# Patient Record
Sex: Male | Born: 1970
Health system: Southern US, Community
[De-identification: ages and names within clinical notes are randomized; demographics above are authoritative.]

## PROBLEM LIST (undated history)

## (undated) DIAGNOSIS — I219 Acute myocardial infarction, unspecified: Secondary | ICD-10-CM

## (undated) DIAGNOSIS — E119 Type 2 diabetes mellitus without complications: Secondary | ICD-10-CM

## (undated) DIAGNOSIS — Z8489 Family history of other specified conditions: Secondary | ICD-10-CM

## (undated) DIAGNOSIS — N189 Chronic kidney disease, unspecified: Secondary | ICD-10-CM

## (undated) DIAGNOSIS — I10 Essential (primary) hypertension: Secondary | ICD-10-CM

## (undated) DIAGNOSIS — E785 Hyperlipidemia, unspecified: Secondary | ICD-10-CM

## (undated) DIAGNOSIS — I2510 Atherosclerotic heart disease of native coronary artery without angina pectoris: Secondary | ICD-10-CM

## (undated) HISTORY — PX: ARTHOSCOPIC ROTAOR CUFF REPAIR: SHX5002

## (undated) HISTORY — PX: APPENDECTOMY: SHX54

## (undated) HISTORY — PX: CHOLECYSTECTOMY: SHX55

---

## 2010-06-08 ENCOUNTER — Emergency Department (HOSPITAL_COMMUNITY): Payer: 59

## 2010-06-08 ENCOUNTER — Emergency Department (HOSPITAL_COMMUNITY)
Admission: EM | Admit: 2010-06-08 | Discharge: 2010-06-08 | Disposition: A | Discharge status: Home / Self Care | Payer: 59 | Attending: Emergency Medicine | Admitting: Emergency Medicine

## 2010-06-08 DIAGNOSIS — S61209A Unspecified open wound of unspecified finger without damage to nail, initial encounter: Secondary | ICD-10-CM | POA: Insufficient documentation

## 2010-06-08 DIAGNOSIS — Y998 Other external cause status: Secondary | ICD-10-CM | POA: Insufficient documentation

## 2010-06-08 DIAGNOSIS — W230XXA Caught, crushed, jammed, or pinched between moving objects, initial encounter: Secondary | ICD-10-CM | POA: Insufficient documentation

## 2010-06-08 DIAGNOSIS — E119 Type 2 diabetes mellitus without complications: Secondary | ICD-10-CM | POA: Insufficient documentation

## 2014-04-21 DIAGNOSIS — I10 Essential (primary) hypertension: Secondary | ICD-10-CM | POA: Diagnosis present

## 2014-06-12 DIAGNOSIS — E1169 Type 2 diabetes mellitus with other specified complication: Secondary | ICD-10-CM | POA: Insufficient documentation

## 2015-04-17 DIAGNOSIS — E66811 Obesity, class 1: Secondary | ICD-10-CM | POA: Insufficient documentation

## 2015-04-17 DIAGNOSIS — E669 Obesity, unspecified: Secondary | ICD-10-CM | POA: Insufficient documentation

## 2015-04-17 DIAGNOSIS — F4322 Adjustment disorder with anxiety: Secondary | ICD-10-CM | POA: Insufficient documentation

## 2015-05-11 DIAGNOSIS — I771 Stricture of artery: Secondary | ICD-10-CM | POA: Insufficient documentation

## 2015-05-11 DIAGNOSIS — I774 Celiac artery compression syndrome: Secondary | ICD-10-CM | POA: Insufficient documentation

## 2017-09-14 DIAGNOSIS — K859 Acute pancreatitis without necrosis or infection, unspecified: Secondary | ICD-10-CM | POA: Insufficient documentation

## 2018-05-08 DIAGNOSIS — Z794 Long term (current) use of insulin: Secondary | ICD-10-CM

## 2018-05-08 DIAGNOSIS — E1165 Type 2 diabetes mellitus with hyperglycemia: Secondary | ICD-10-CM | POA: Insufficient documentation

## 2019-03-28 ENCOUNTER — Other Ambulatory Visit: Payer: Self-pay | Admitting: Neurological Surgery

## 2019-03-28 DIAGNOSIS — M545 Low back pain, unspecified: Secondary | ICD-10-CM

## 2019-04-01 ENCOUNTER — Ambulatory Visit
Admission: RE | Admit: 2019-04-01 | Discharge: 2019-04-01 | Disposition: A | Discharge status: Home / Self Care | Payer: BC Managed Care – PPO | Source: Ambulatory Visit | Attending: Neurological Surgery | Admitting: Neurological Surgery

## 2019-04-01 ENCOUNTER — Other Ambulatory Visit: Payer: Self-pay

## 2019-04-01 DIAGNOSIS — M545 Low back pain, unspecified: Secondary | ICD-10-CM

## 2019-04-01 IMAGING — MR MR LUMBAR SPINE W/O CM
4 of 5 series · 14 of 48 positions shown · non-contrast
Comparison: No pertinent prior studies available for comparison.

CLINICAL DATA: Low back pain. Additional history provided by
[HOSPITAL]weeks of bilateral low back pain that runs into
anterior left leg accompanied by numbness and weakness.

EXAM:
MRI LUMBAR SPINE WITHOUT CONTRAST
TECHNIQUE: Multiplanar, multisequence MR imaging of the lumbar spine was
performed. No intravenous contrast was administered.

[Series 5: T2 · sagittal · 4.0mm · 0.44mm/px · 5 of 13 slices shown (1 of 2)]
[im 1/13]
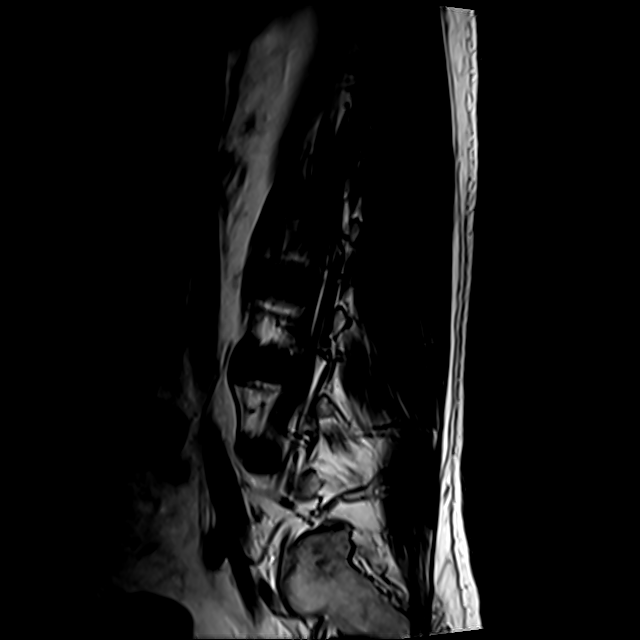
[im 3/13]
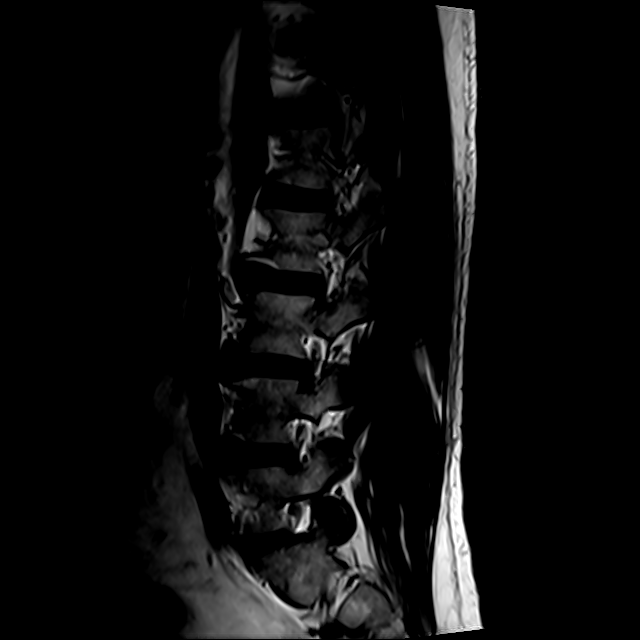
[im 5/13]
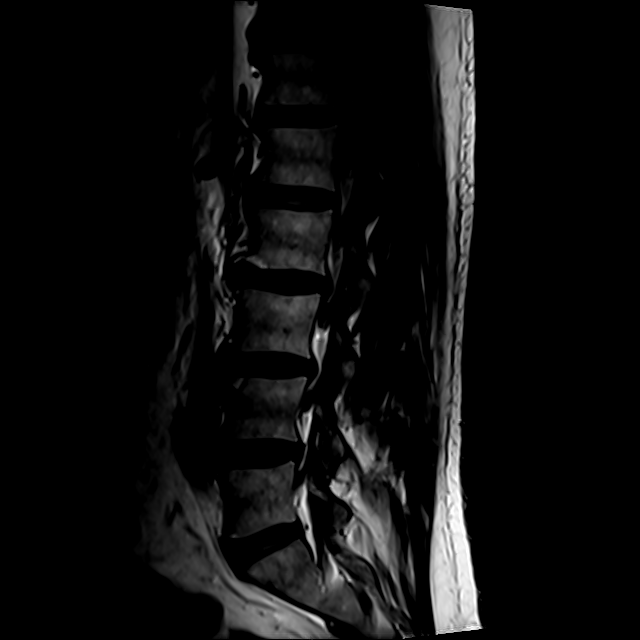
[im 8/13]
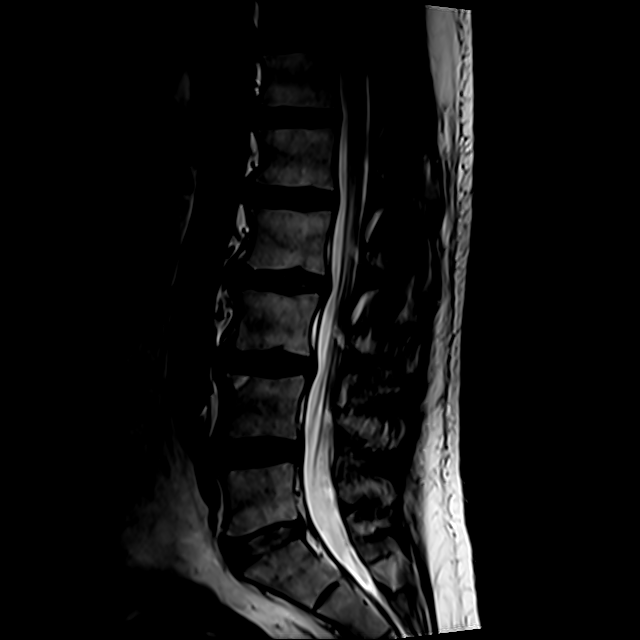
[im 13/13]
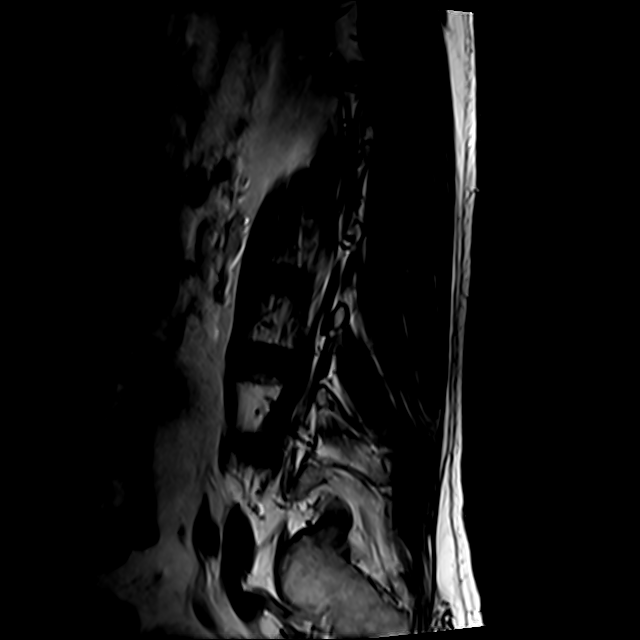

[Series 6: T1 · sagittal · 4.0mm · 0.44mm/px · 3 of 13 slices shown (1 of 2)]
[im 1/13]
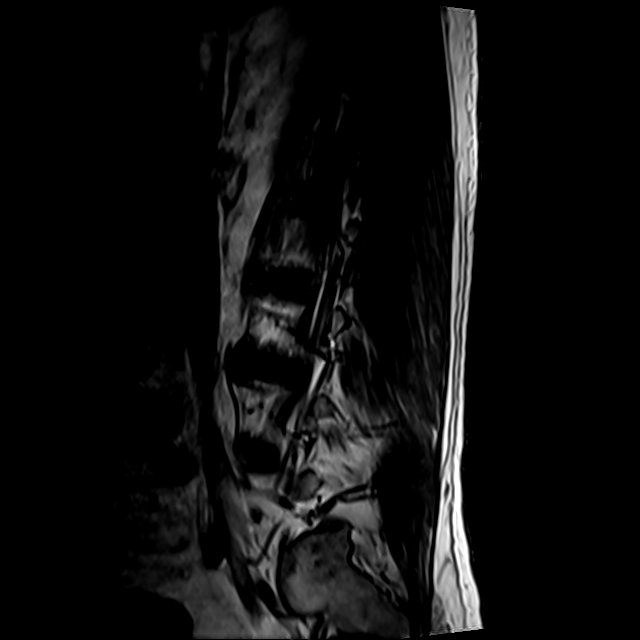
[im 7/13]
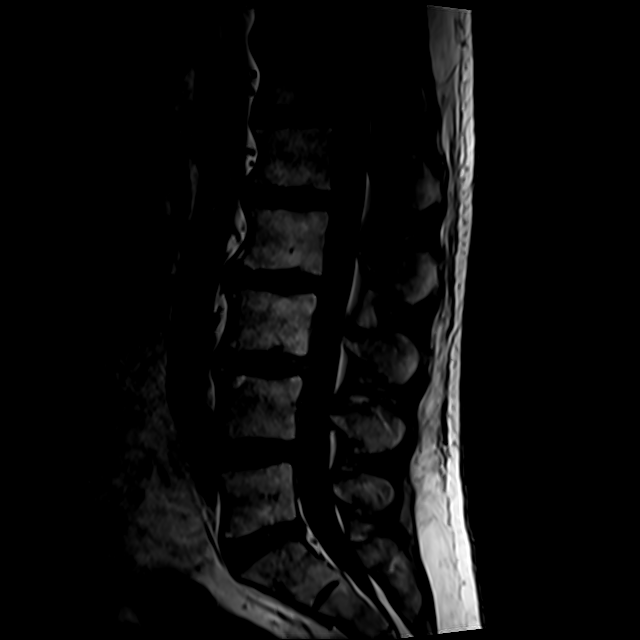
[im 13/13]
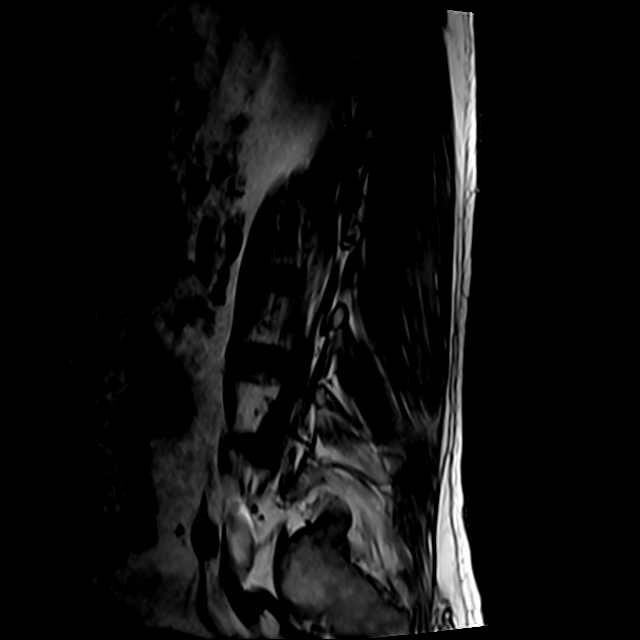

[Series 10: T2 · axial · 4.0mm · 0.35mm/px · z∈[-85,+78]mm · 3 of 39 slices shown (2 of 2)]
[im 6/39]
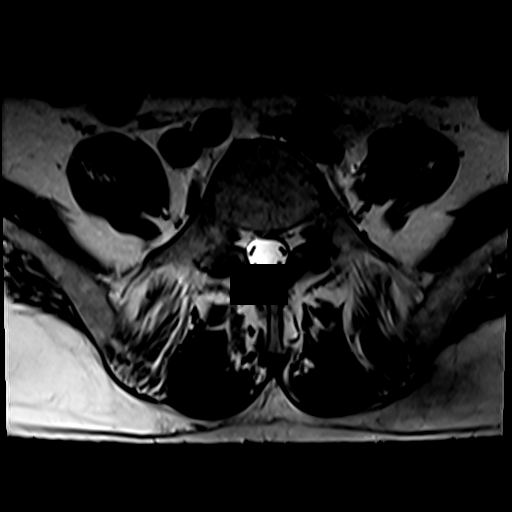
[im 21/39]
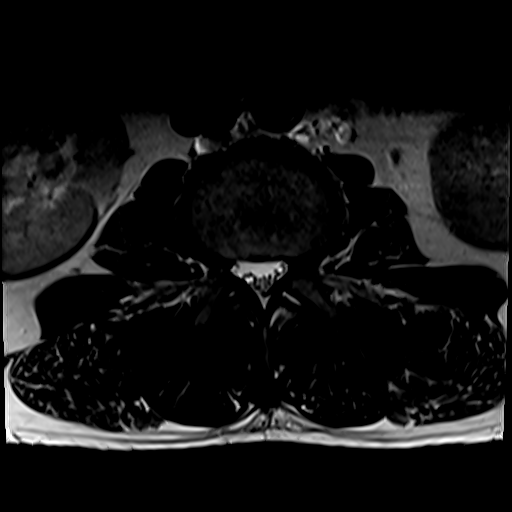
[im 33/39]
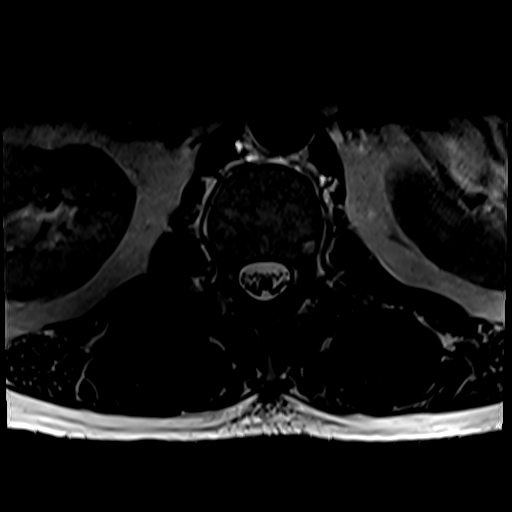

[Series 100: T1 · axial · 4.0mm · 0.35mm/px · z∈[-85,+78]mm · 3 of 39 slices shown (2 of 2)]
[im 6/39]
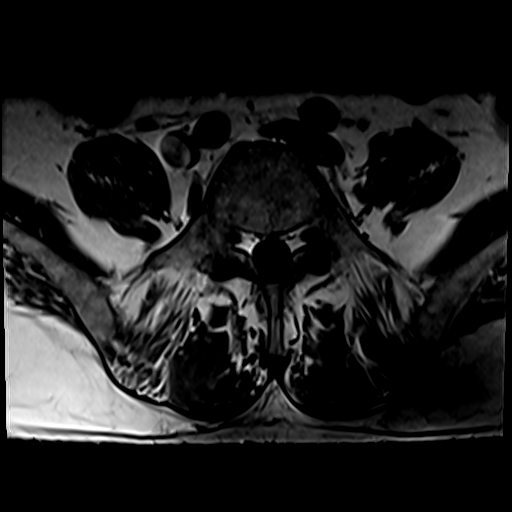
[im 21/39]
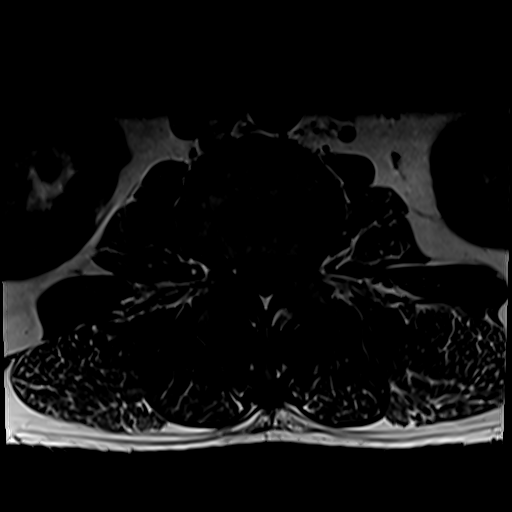
[im 33/39]
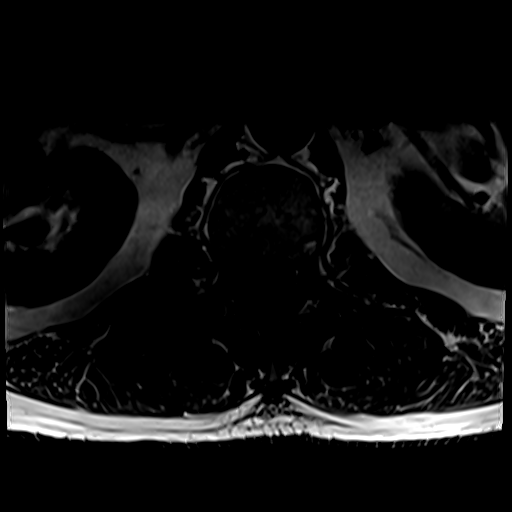

[14 of 48 positions shown; findings below may reference images not displayed]

FINDINGS: Segmentation: For the purposes of this dictation, five lumbar
vertebrae are assumed and the caudal most well-formed intervertebral
disc is designated L5-S1.

Alignment: Reversal of the expected lumbar lordosis. Trace
retrolisthesis at the L2-L3 through L4-L5 levels.

Vertebrae: Vertebral body height is maintained. Trace fatty
degenerative endplate marrow signal anteriorly at L2-L3 and L3-L4.
No marrow edema or suspicious osseous lesion.

Conus medullaris and cauda equina: Conus extends to the L1-L2 level.
No signal abnormality within the visualized distal spinal cord.

Paraspinal and other soft tissues: Mild asymmetric prominence of the
left renal pelvis without overt hydronephrosis. Paraspinal soft
tissues within normal limits.

Disc levels:

Mild multilevel disc degeneration.

Congenitally narrow lumbar spinal canal.

T12-L1: No disc herniation. No significant degenerative canal or
foraminal stenosis.

L1-L2: Mild facet arthrosis. No disc herniation. No significant
degenerative canal or foraminal stenosis.

L2-L3: Disc bulge. Mild facet arthrosis/ligamentum flavum
hypertrophy. Mild bilateral subarticular narrowing (greater on the
left) with crowding of the descending left L3 nerve root (series 10,
image 18). Mild relative narrowing of the central canal. No
significant neural foraminal narrowing.

L3-L4: Disc bulge. Mild facet arthrosis/ligamentum flavum
hypertrophy. Bilateral subarticular narrowing (greater on the left)
with crowding of the descending left L4 nerve root (series 10, image
23). There is also mild relative narrowing of the central canal and
left neural foramen.

L4-L5: Minimal disc bulge. Mild facet arthrosis/ligamentum flavum
hypertrophy. Mild left subarticular/lateral recess narrowing with
slight crowding of the descending left L5 nerve root. Central canal
patent. No significant neural foraminal narrowing.

L5-S1: Minimal disc bulge. Mild facet arthrosis. No significant
spinal canal or neural foraminal narrowing.
IMPRESSION: Lumbar spondylosis superimposed upon a congenitally narrow lumbar
spinal canal as outlined and most notably as follows

At L2-L3, multifactorial mild bilateral subarticular narrowing
(greater on the left) with crowding of the descending left L3 nerve
root. Mild relative narrowing of the central canal.

At L3-L4, multifactorial bilateral subarticular narrowing (greater
on the left) with crowding of the descending left L4 nerve root.
Mild relative narrowing of the central canal and left neural
foramen.

At L4-L5, multifactorial left subarticular/lateral recess narrowing
with slight crowding of the descending left L5 nerve root.

## 2020-05-01 ENCOUNTER — Encounter (HOSPITAL_COMMUNITY)
Admission: EM | Disposition: A | Discharge status: Home / Self Care | Payer: Self-pay | Source: Home / Self Care | Attending: Interventional Cardiology

## 2020-05-01 ENCOUNTER — Inpatient Hospital Stay (HOSPITAL_COMMUNITY)
Admission: EM | Admit: 2020-05-01 | Discharge: 2020-05-04 | DRG: 246 | Disposition: A | Discharge status: Home / Self Care | Payer: BC Managed Care – PPO | Attending: Interventional Cardiology | Admitting: Interventional Cardiology

## 2020-05-01 DIAGNOSIS — Z8249 Family history of ischemic heart disease and other diseases of the circulatory system: Secondary | ICD-10-CM

## 2020-05-01 DIAGNOSIS — Z794 Long term (current) use of insulin: Secondary | ICD-10-CM | POA: Diagnosis not present

## 2020-05-01 DIAGNOSIS — I110 Hypertensive heart disease with heart failure: Secondary | ICD-10-CM | POA: Diagnosis present

## 2020-05-01 DIAGNOSIS — I10 Essential (primary) hypertension: Secondary | ICD-10-CM | POA: Diagnosis not present

## 2020-05-01 DIAGNOSIS — Z955 Presence of coronary angioplasty implant and graft: Secondary | ICD-10-CM

## 2020-05-01 DIAGNOSIS — E781 Pure hyperglyceridemia: Secondary | ICD-10-CM | POA: Diagnosis not present

## 2020-05-01 DIAGNOSIS — I2510 Atherosclerotic heart disease of native coronary artery without angina pectoris: Secondary | ICD-10-CM

## 2020-05-01 DIAGNOSIS — E785 Hyperlipidemia, unspecified: Secondary | ICD-10-CM

## 2020-05-01 DIAGNOSIS — F419 Anxiety disorder, unspecified: Secondary | ICD-10-CM | POA: Diagnosis present

## 2020-05-01 DIAGNOSIS — I2111 ST elevation (STEMI) myocardial infarction involving right coronary artery: Secondary | ICD-10-CM | POA: Diagnosis not present

## 2020-05-01 DIAGNOSIS — E1169 Type 2 diabetes mellitus with other specified complication: Secondary | ICD-10-CM

## 2020-05-01 DIAGNOSIS — I5033 Acute on chronic diastolic (congestive) heart failure: Secondary | ICD-10-CM | POA: Diagnosis present

## 2020-05-01 DIAGNOSIS — Z880 Allergy status to penicillin: Secondary | ICD-10-CM

## 2020-05-01 DIAGNOSIS — G4733 Obstructive sleep apnea (adult) (pediatric): Secondary | ICD-10-CM | POA: Diagnosis present

## 2020-05-01 DIAGNOSIS — I2119 ST elevation (STEMI) myocardial infarction involving other coronary artery of inferior wall: Principal | ICD-10-CM | POA: Diagnosis present

## 2020-05-01 DIAGNOSIS — Z79899 Other long term (current) drug therapy: Secondary | ICD-10-CM

## 2020-05-01 DIAGNOSIS — E1165 Type 2 diabetes mellitus with hyperglycemia: Secondary | ICD-10-CM | POA: Diagnosis present

## 2020-05-01 DIAGNOSIS — I213 ST elevation (STEMI) myocardial infarction of unspecified site: Principal | ICD-10-CM

## 2020-05-01 DIAGNOSIS — E782 Mixed hyperlipidemia: Secondary | ICD-10-CM | POA: Diagnosis present

## 2020-05-01 DIAGNOSIS — E118 Type 2 diabetes mellitus with unspecified complications: Secondary | ICD-10-CM | POA: Diagnosis not present

## 2020-05-01 DIAGNOSIS — Z20822 Contact with and (suspected) exposure to covid-19: Secondary | ICD-10-CM | POA: Diagnosis present

## 2020-05-01 DIAGNOSIS — E1159 Type 2 diabetes mellitus with other circulatory complications: Secondary | ICD-10-CM | POA: Diagnosis not present

## 2020-05-01 HISTORY — DX: Atherosclerotic heart disease of native coronary artery without angina pectoris: I25.10

## 2020-05-01 HISTORY — DX: Hyperlipidemia, unspecified: E78.5

## 2020-05-01 HISTORY — DX: Type 2 diabetes mellitus without complications: E11.9

## 2020-05-01 HISTORY — DX: Family history of other specified conditions: Z84.89

## 2020-05-01 HISTORY — DX: Essential (primary) hypertension: I10

## 2020-05-01 HISTORY — PX: LEFT HEART CATH AND CORONARY ANGIOGRAPHY: CATH118249

## 2020-05-01 HISTORY — PX: CORONARY/GRAFT ACUTE MI REVASCULARIZATION: CATH118305

## 2020-05-01 SURGERY — CORONARY/GRAFT ACUTE MI REVASCULARIZATION
Anesthesia: LOCAL

## 2020-05-01 MED ORDER — LIDOCAINE HCL (PF) 1 % IJ SOLN
INTRAMUSCULAR | Status: DC | PRN
  Administered 2020-05-01: 5 mL via SUBCUTANEOUS

## 2020-05-01 MED ORDER — SODIUM CHLORIDE 0.9 % IV SOLN: INTRAVENOUS | Status: DC

## 2020-05-01 MED ORDER — HEPARIN SODIUM (PORCINE) 1000 UNIT/ML IJ SOLN
INTRAMUSCULAR | Status: AC
  Filled 2020-05-01: qty 1

## 2020-05-01 MED ORDER — HEPARIN SODIUM (PORCINE) 1000 UNIT/ML IJ SOLN
INTRAMUSCULAR | Status: DC | PRN
  Administered 2020-05-01: 9000 [IU] via INTRAVENOUS

## 2020-05-01 MED ORDER — VERAPAMIL HCL 2.5 MG/ML IV SOLN
INTRAVENOUS | Status: AC
  Filled 2020-05-01: qty 2

## 2020-05-01 MED ORDER — LIDOCAINE HCL (PF) 1 % IJ SOLN
INTRAMUSCULAR | Status: AC
  Filled 2020-05-01: qty 30

## 2020-05-01 MED ORDER — SODIUM CHLORIDE 0.9 % IV SOLN
INTRAVENOUS | Status: AC | PRN
  Administered 2020-05-01: 100 mL/h via INTRAVENOUS

## 2020-05-01 MED ORDER — HEPARIN SODIUM (PORCINE) 5000 UNIT/ML IJ SOLN: 4000.0000 [IU] | Freq: Once | INTRAMUSCULAR | Status: DC

## 2020-05-01 MED ORDER — TIROFIBAN (AGGRASTAT) BOLUS VIA INFUSION
INTRAVENOUS | Status: DC | PRN
  Administered 2020-05-01: 2500 ug via INTRAVENOUS

## 2020-05-01 MED ORDER — TIROFIBAN HCL IN NACL 5-0.9 MG/100ML-% IV SOLN
INTRAVENOUS | Status: AC
  Filled 2020-05-01: qty 100

## 2020-05-01 MED ORDER — TIROFIBAN HCL IN NACL 5-0.9 MG/100ML-% IV SOLN
INTRAVENOUS | Status: AC | PRN
  Administered 2020-05-01: 0.15 ug/kg/min via INTRAVENOUS

## 2020-05-01 MED ORDER — VERAPAMIL HCL 2.5 MG/ML IV SOLN
INTRAVENOUS | Status: DC | PRN
  Administered 2020-05-01: 10 mL via INTRA_ARTERIAL

## 2020-05-01 MED ORDER — IOHEXOL 350 MG/ML SOLN
INTRAVENOUS | Status: AC
  Filled 2020-05-01: qty 1

## 2020-05-01 MED ORDER — HEPARIN (PORCINE) IN NACL 1000-0.9 UT/500ML-% IV SOLN
INTRAVENOUS | Status: AC
  Filled 2020-05-01: qty 1000

## 2020-05-01 SURGICAL SUPPLY — 18 items
BALLN SAPPHIRE 2.0X15 (BALLOONS) ×2
BALLN SAPPHIRE ~~LOC~~ 3.0X12 (BALLOONS) ×2 IMPLANT
BALLOON SAPPHIRE 2.0X15 (BALLOONS) ×1 IMPLANT
CATH INFINITI 5 FR JL3.5 (CATHETERS) ×2 IMPLANT
CATH LAUNCHER 6FR JR4 (CATHETERS) ×2 IMPLANT
DEVICE RAD COMP TR BAND LRG (VASCULAR PRODUCTS) ×2 IMPLANT
GLIDESHEATH SLEND SS 6F .021 (SHEATH) ×2 IMPLANT
GUIDEWIRE INQWIRE 1.5J.035X260 (WIRE) ×1 IMPLANT
INQWIRE 1.5J .035X260CM (WIRE) ×2
KIT ENCORE 26 ADVANTAGE (KITS) ×2 IMPLANT
KIT HEART LEFT (KITS) ×2 IMPLANT
KIT HEMO VALVE WATCHDOG (MISCELLANEOUS) ×2 IMPLANT
PACK CARDIAC CATHETERIZATION (CUSTOM PROCEDURE TRAY) ×2 IMPLANT
STENT RESOLUTE ONYX 2.5X18 (Permanent Stent) ×2 IMPLANT
SYR MEDRAD MARK 7 150ML (SYRINGE) ×2 IMPLANT
TRANSDUCER W/STOPCOCK (MISCELLANEOUS) ×2 IMPLANT
TUBING CIL FLEX 10 FLL-RA (TUBING) ×2 IMPLANT
WIRE ASAHI PROWATER 180CM (WIRE) ×2 IMPLANT

## 2020-05-01 NOTE — H&P (Addendum)
Cardiology History & Physical    Patient ID: Dan Ford MRN: [id number], DOB/AGE: 03-Jun-1970   Admit date: 05/01/2020  Primary Physician: Shellia Cleverly, PA Primary Cardiologist: No primary care provider on file.  Patient Profile    50 year old male with history of T2DM on insulin, hypertriglceridemia, pancreatitis due to this, anxiety disorder presenting with acute chest pain and inferior STEMI.  History of Present Illness    50 year old male with history as above who developed acute chest pain around 9PM this night.  He reports having had intermittent chest pains for the last three-four days that was stuttering with exertion.  He had some right-sided arm discomfort as well.  This evening he developed acute chest pressure and pain that provoked him to call EMS.  He was found to have inferior STE with lateral depressions and STEMI was activated.  Significant past medical history of pancreatitis induced by hypertriglyceridemia and has been admitted for this as recently as this year.  He has seen an endocrinologist and its suspected this is genetically mediated as diabetes is fairly well controlled.  Past Medical History   T2DM on insulin Hypertriglyceridemia HTN Pancreatitis Anxiety disorder  Allergies Allergies  Allergen Reactions  . Amoxicillin Nausea Only    Home Medications    Prior to Admission medications   Medication Sig Start Date End Date Taking? Authorizing Provider  escitalopram (LEXAPRO) 20 MG tablet 20 mg daily. 05/28/16   [provider]  fenofibrate 160 MG tablet Take 160 mg by mouth daily. 04/04/20   [provider]  lisinopril (ZESTRIL) 10 MG tablet 10 mg daily. 12/05/15   [provider]  Omega-3 1000 MG CAPS Take 1,000 mg by mouth daily.    [provider]  pantoprazole (PROTONIX) 40 MG tablet Take 40 mg by mouth daily. 04/09/20   [provider]  rosuvastatin (CRESTOR) 40 MG tablet Take 40 mg by mouth daily.  04/04/20   [provider]  VASCEPA 1 g capsule Take 2 g by mouth 2 (two) times daily. 04/13/20   [provider]    Family History    Father had CHF Mother with AF  Social History   Non-smoker.  No etOH use.  No drug use.  Accompanied in ED by his attentive wife.  Works as a Production designer, theatre/television/film.  Review of Systems    General:  No chills, fever, night sweats or weight changes.  Cardiovascular:  No chest pain, dyspnea on exertion, edema, orthopnea, palpitations, paroxysmal nocturnal dyspnea. Dermatological: No rash, lesions/masses Respiratory: No cough, dyspnea Urologic: No hematuria, dysuria Abdominal:   No nausea, vomiting, diarrhea, bright red blood per rectum, melena, or hematemesis Neurologic:  No visual changes, wkns, changes in mental status. All other systems reviewed and are otherwise negative except as noted above.  Physical Exam    BP 115/71 (BP Location: Right Arm)   Pulse 94   Temp 97.6 F (36.4 C) (Oral)   Resp 20   SpO2 99%  General: Alert, NAD HEENT: Normal  Neck: No bruits or JVD. Lungs:  Resp regular and unlabored, CTA bilaterally. Heart: Regular rhythm, no s3, s4, or murmurs. Abdomen: Soft, non-tender, non-distended, BS +.  Extremities: Warm. No clubbing, cyanosis or edema. DP/PT/Radials 2+ and equal bilaterally. Psych: Normal affect. Neuro: Alert and oriented. No gross focal deficits. No abnormal movements.  Labs   Labs pending. Historically normal GFR  Radiology Studies    No results found.  ECG & Cardiac Imaging  Normal sinus rhythm, STE III, AVF with slight reciprocal lateral changes. Invasive angigoram, emergent with mild CAD (tapering bifurcating LAD), acute occlusion of distal RCA.  Assessment & Plan    50 year old male with history of HTN, T2DM on insulin, severe hypertriglyceridemia, nonobstructive CAD, obesity, anxiety disorder presenting with acute inferior STEMI now s/p PCI.  Problem list STEMI HTN T2DM on  insulin Hypertriglceridemia with history of recurrent pancreatitis  Plan #STEMI, dRCA s/p PCI - ASA 81 mg indefinitely - Ticagrelor in lab, 90 mg BID at one year - Rosuvastatin 40 mg daily - Start metoprolol tartrate 12.5 mg BID - Admit to CVICU, continuous cardiac monitoring - TTE in morning - Risk stratification labs  #HTN - Continue lisinopril 10 mg daily, titrate as tolerated  #T2DM on insulin therapy - High insulin requirements (50-80 units daily) with recurrent pancreatitis - Insulin infusion in unit, watch closely goal glucose 110-160. - Empagliflozin at discharge - Note GLP1RA contraindicated  Hypertriglyceridemia with history recurrent pancreatitis - Continue rosuvastatin 40 mg daily, Vascepa 2g BID, Fenofibrate 160 mg daily  Malnutrition: None Nutrition: Heart healthy diet, moderate carbohydrate DVT ppx: Lovenox GI ppx: Omeprazole (home med) Advanced Care Planning: Full code   Signed, Regino Schultze, MD 05/01/2020, 11:42 PM  I have examined the patient and reviewed assessment and plan and discussed with patient.  Agree with above as stated.  I personally reviewed the ECG and made the decision for the patient to have emergency cardiac cath.  The patient had been having discomfort over the past 3 days on and off.  It became severe tonight and he called EMS shortly thereafter.  Initial ECG done by EMS showed inferior ST elevations.  Upon arrival to the Cath Lab, his blood pressure was stable but he was still having 7 out of 10 chest pain.   Mildly anxious Regular rate and rhythm 2+ right radial pulse Hospital ECG confirmed inferior ST elevation with high lateral ST depression  Long-term, he will need diabetes management and aggressive risk factor modification including triglyceride lowering therapy.  Further plans based on cardiac catheterization  Lance Muss  Addendum: Cardiac cath revealed occluded distal RCA.  This was successfully stented with a 2.5 x 18  resolute drug-eluting stent, postdilated to 3 mm proximally.  He has diffuse disease in the posterior lateral artery.  Ejection fraction was normal with basal inferior hypokinesis.  LVEDP 11 mmHg. Start low-dose metoprolol, high-dose statin.  Insulin for diabetes management. We will refer to our Pharm.D. lipid clinic to see if there are other therapies or trials that may be of benefit to him.  Corky Crafts, MD

## 2020-05-01 NOTE — ED Provider Notes (Signed)
[facility] EMERGENCY DEPARTMENT Provider Note   CSN: [id number] Arrival date & time: 05/01/20  2309     History Chief Complaint  Patient presents with  . Code STEMI   Level 5 caveat due to acuity of condition Dan Ford is a 50 y.o. male.  The history is provided by the patient and the EMS personnel.  Chest Pain Pain location:  Substernal area Pain quality: pressure   Pain radiates to:  L arm Pain severity:  Severe Onset quality:  Sudden Duration:  1 hour Timing:  Constant Progression:  Worsening Chronicity:  New Relieved by:  Nitroglycerin and aspirin Worsened by:  Nothing Associated symptoms: shortness of breath   Patient presents via EMS for chest pain.  He reports episodes intermittently over the past several days, but this episode began about an hour ago was very severe.  EMS was called who noted the patient was having ST elevation MI Patient given aspirin/nitroglycerin     PMH- diabetes, hyperlipidemia Soc hx - unknown Family history - +CAD Home Medications Prior to Admission medications   Not on File    Allergies    Patient has no allergy information on record.  Review of Systems   Review of Systems  Unable to perform ROS: Acuity of condition  Respiratory: Positive for shortness of breath.   Cardiovascular: Positive for chest pain.    Physical Exam Updated Vital Signs BP 117/71   Pulse 98   Resp (!) 27   SpO2 100%   Physical Exam CONSTITUTIONAL: Ill-appearing HEAD: Normocephalic/atraumatic EYES: EOMI ENMT: Mucous membranes moist NECK: supple no meningeal signs SPINE/BACK:entire spine nontender CV: S1/S2 noted, no murmurs/rubs/gallops noted LUNGS: Lungs are clear to auscultation bilaterally, no apparent distress ABDOMEN: soft, nontender, no rebound or guarding, bowel sounds noted throughout abdomen GU:no cva tenderness NEURO: Pt is awake/alert/appropriate, moves all extremitiesx4.  No facial droop.   EXTREMITIES:  pulses normal/equalx4, full ROM SKIN: Pale PSYCH: Unable to assess  ED Results / Procedures / Treatments   Labs (all labs ordered are listed, but only abnormal results are displayed) Labs Reviewed  RESP PANEL BY RT-PCR (FLU A&B, COVID) ARPGX2  HEMOGLOBIN A1C  CBC WITH DIFFERENTIAL/PLATELET  PROTIME-INR  APTT  COMPREHENSIVE METABOLIC PANEL  LIPID PANEL  TROPONIN I (HIGH SENSITIVITY)    EKG EKG Interpretation  Date/Time:  Tuesday May 01 2020 23:10:09 EST Ventricular Rate:  94 PR Interval:    QRS Duration: 88 QT Interval:  358 QTC Calculation: 448 R Axis:   52 Text Interpretation: Sinus rhythm Inferior infarct, acute (RCA) Lateral leads are also involved Probable RV involvement, suggest recording right precordial leads >>> Acute MI <<< ** ** ACUTE MI / STEMI ** ** Confirmed by Zadie Rhine ([id number]) on 05/01/2020 11:14:26 PM   Radiology No results found.  Procedures .Critical Care Performed by: Zadie Rhine, MD Authorized by: Zadie Rhine, MD   Critical care provider statement:    Critical care time (minutes):  30   Critical care start time:  05/01/2020 11:09 PM   Critical care end time:  05/01/2020 11:39 PM   Critical care time was exclusive of:  Separately billable procedures and treating other patients   Critical care was necessary to treat or prevent imminent or life-threatening deterioration of the following conditions:  Cardiac failure and circulatory failure   Critical care was time spent personally by me on the following activities:  Examination of patient, discussions with consultants and pulse oximetry   I assumed direction of critical care  for this patient from another provider in my specialty: no     Care discussed with: admitting provider       Medications Ordered in ED Medications  0.9 %  sodium chloride infusion (has no administration in time range)  heparin injection 60 Units/kg (has no administration in time range)    ED Course  I have  reviewed the triage vital signs and the nursing notes.      MDM Rules/Calculators/A&P                          Patient seen on arrival as a code STEMI.  Patient reports severe pain of the past hour.  Patient is ill-appearing and pale.  EKG reviewed which reveals inferior STEMI.  He is already received aspirin nitroglycerin.  Heparin has been ordered.  Cardiology fellow was at the bedside who will take patient emergently to the cardiac Cath Lab.  I have attempted to call his wife Jeanice Lim at the following number [phone] but it goes straight to voicemail. Final Clinical Impression(s) / ED Diagnoses Final diagnoses:  ST elevation myocardial infarction (STEMI), unspecified artery [facility])    Rx / DC Orders ED Discharge Orders    None       Zadie Rhine, MD 05/01/20 2345

## 2020-05-01 NOTE — ED Triage Notes (Signed)
Pt arrives to ED BIB [facility] as a Code STEMI. Per EMS x1hr ago pt was getting ready for bed and began to experience crushing chest pain that has been occurring for a couple of days but tonight was worse. Per EMS pt also had RT Sided arm numbness as well as SHOB. 324mg  Aspirin and 2 of Nitro administered by EMS. Pt is A/O x4. Cardiologist and EDP at bedside upon pt's arrival.   BP 116/58 HR 90

## 2020-05-01 NOTE — Progress Notes (Signed)
Patient came to ED and went straight to Cath Lab.   Wife was waiting in [facility] and parents of the patient arrived. I met them at the Emergency  Dept and brought them  up to [facility] to be with their daughter in law. Had prayer with them and will check back with them. Conard Novak, Chaplain   05/01/20 2300  Clinical Encounter Type  Visited With Family  Visit Type Initial;Spiritual support  Referral From Care management  Consult/Referral To Chaplain  Spiritual Encounters  Spiritual Needs Prayer;Other (Comment)  Stress Factors  Family Stress Factors Health changes

## 2020-05-02 ENCOUNTER — Encounter (HOSPITAL_COMMUNITY): Payer: Self-pay | Admitting: Interventional Cardiology

## 2020-05-02 ENCOUNTER — Inpatient Hospital Stay (HOSPITAL_COMMUNITY): Payer: BC Managed Care – PPO

## 2020-05-02 ENCOUNTER — Other Ambulatory Visit: Payer: Self-pay

## 2020-05-02 DIAGNOSIS — I213 ST elevation (STEMI) myocardial infarction of unspecified site: Secondary | ICD-10-CM | POA: Diagnosis not present

## 2020-05-02 DIAGNOSIS — Z794 Long term (current) use of insulin: Secondary | ICD-10-CM

## 2020-05-02 DIAGNOSIS — E118 Type 2 diabetes mellitus with unspecified complications: Secondary | ICD-10-CM

## 2020-05-02 DIAGNOSIS — I5033 Acute on chronic diastolic (congestive) heart failure: Secondary | ICD-10-CM

## 2020-05-02 DIAGNOSIS — G4733 Obstructive sleep apnea (adult) (pediatric): Secondary | ICD-10-CM

## 2020-05-02 DIAGNOSIS — E782 Mixed hyperlipidemia: Secondary | ICD-10-CM

## 2020-05-02 DIAGNOSIS — I2510 Atherosclerotic heart disease of native coronary artery without angina pectoris: Secondary | ICD-10-CM | POA: Diagnosis not present

## 2020-05-02 DIAGNOSIS — I2111 ST elevation (STEMI) myocardial infarction involving right coronary artery: Secondary | ICD-10-CM

## 2020-05-02 DIAGNOSIS — E1169 Type 2 diabetes mellitus with other specified complication: Secondary | ICD-10-CM

## 2020-05-02 LAB — HIV ANTIBODY (ROUTINE TESTING W REFLEX): HIV Screen 4th Generation wRfx: NONREACTIVE

## 2020-05-02 LAB — COMPREHENSIVE METABOLIC PANEL
ALT: 22 U/L (ref 0–44)
AST: 47 U/L — ABNORMAL HIGH (ref 15–41)
Albumin: 3 g/dL — ABNORMAL LOW (ref 3.5–5.0)
Alkaline Phosphatase: 84 U/L (ref 38–126)
Anion gap: 15 (ref 5–15)
BUN: 24 mg/dL — ABNORMAL HIGH (ref 6–20)
CO2: 17 mmol/L — ABNORMAL LOW (ref 22–32)
Calcium: 8.5 mg/dL — ABNORMAL LOW (ref 8.9–10.3)
Chloride: 98 mmol/L (ref 98–111)
Creatinine, Ser: 0.71 mg/dL (ref 0.61–1.24)
GFR, Estimated: >60 mL/min (ref >60)
Glucose, Bld: 308 mg/dL — ABNORMAL HIGH (ref 70–99)
Potassium: 3.7 mmol/L (ref 3.5–5.1)
Sodium: 130 mmol/L — ABNORMAL LOW (ref 135–145)
Total Bilirubin: 1 mg/dL (ref 0.3–1.2)
Total Protein: 5.5 g/dL — ABNORMAL LOW (ref 6.5–8.1)

## 2020-05-02 LAB — CBC WITH DIFFERENTIAL/PLATELET
Abs Immature Granulocytes: 0.06 10*3/uL (ref 0.00–0.07)
Basophils Absolute: 0 10*3/uL (ref 0.0–0.1)
Basophils Relative: 1 %
Eosinophils Absolute: 0.2 10*3/uL (ref 0.0–0.5)
Eosinophils Relative: 2 %
HCT: 32.9 % — ABNORMAL LOW (ref 39.0–52.0)
Hemoglobin: 11.6 g/dL — ABNORMAL LOW (ref 13.0–17.0)
Immature Granulocytes: 1 %
Lymphocytes Relative: 14 %
Lymphs Abs: 1.2 10*3/uL (ref 0.7–4.0)
MCH: 30.9 pg (ref 26.0–34.0)
MCHC: 35.3 g/dL (ref 30.0–36.0)
MCV: 87.5 fL (ref 80.0–100.0)
Monocytes Absolute: 0.4 10*3/uL (ref 0.1–1.0)
Monocytes Relative: 5 %
Neutro Abs: 6.7 10*3/uL (ref 1.7–7.7)
Neutrophils Relative %: 77 %
Platelets: 239 10*3/uL (ref 150–400)
RBC: 3.76 MIL/uL — ABNORMAL LOW (ref 4.22–5.81)
RDW: 12.6 % (ref 11.5–15.5)
WBC: 8.5 10*3/uL (ref 4.0–10.5)
nRBC: 0.4 % — ABNORMAL HIGH (ref 0.0–0.2)

## 2020-05-02 LAB — MRSA PCR SCREENING: MRSA by PCR: NEGATIVE

## 2020-05-02 LAB — GLUCOSE, CAPILLARY
Glucose-Capillary: 257 mg/dL — ABNORMAL HIGH (ref 70–99)
Glucose-Capillary: 247 mg/dL — ABNORMAL HIGH (ref 70–99)
Glucose-Capillary: 279 mg/dL — ABNORMAL HIGH (ref 70–99)
Glucose-Capillary: 201 mg/dL — ABNORMAL HIGH (ref 70–99)
Glucose-Capillary: 160 mg/dL — ABNORMAL HIGH (ref 70–99)
Glucose-Capillary: 124 mg/dL — ABNORMAL HIGH (ref 70–99)
Glucose-Capillary: 129 mg/dL — ABNORMAL HIGH (ref 70–99)
Glucose-Capillary: 227 mg/dL — ABNORMAL HIGH (ref 70–99)
Glucose-Capillary: 196 mg/dL — ABNORMAL HIGH (ref 70–99)
Glucose-Capillary: 213 mg/dL — ABNORMAL HIGH (ref 70–99)
Glucose-Capillary: 155 mg/dL — ABNORMAL HIGH (ref 70–99)
Glucose-Capillary: 238 mg/dL — ABNORMAL HIGH (ref 70–99)
Glucose-Capillary: 269 mg/dL — ABNORMAL HIGH (ref 70–99)

## 2020-05-02 LAB — POCT I-STAT, CHEM 8
BUN: 23 mg/dL — ABNORMAL HIGH (ref 6–20)
Calcium, Ion: 1.24 mmol/L (ref 1.15–1.40)
Chloride: 100 mmol/L (ref 98–111)
Creatinine, Ser: 0.6 mg/dL — ABNORMAL LOW (ref 0.61–1.24)
Glucose, Bld: 302 mg/dL — ABNORMAL HIGH (ref 70–99)
HCT: 33 % — ABNORMAL LOW (ref 39.0–52.0)
Hemoglobin: 11.2 g/dL — ABNORMAL LOW (ref 13.0–17.0)
Potassium: 3.2 mmol/L — ABNORMAL LOW (ref 3.5–5.1)
Sodium: 133 mmol/L — ABNORMAL LOW (ref 135–145)
TCO2: 21 mmol/L — ABNORMAL LOW (ref 22–32)

## 2020-05-02 LAB — ECHOCARDIOGRAM COMPLETE
Area-P 1/2: 3.99 cm2
Calc EF: 58.1 %
S' Lateral: 3 cm
Single Plane A2C EF: 58.5 %
Single Plane A4C EF: 56.8 %

## 2020-05-02 LAB — PROTIME-INR
INR: 1 (ref 0.8–1.2)
Prothrombin Time: 13.8 seconds (ref 11.4–15.2)

## 2020-05-02 LAB — TROPONIN I (HIGH SENSITIVITY)
Troponin I (High Sensitivity): 4155 ng/L (ref <18)
Troponin I (High Sensitivity): 21317 ng/L (ref <18)

## 2020-05-02 LAB — POCT ACTIVATED CLOTTING TIME: Activated Clotting Time: 345 seconds

## 2020-05-02 LAB — APTT: aPTT: >200 seconds (ref 24–36)

## 2020-05-02 LAB — LIPID PANEL
Cholesterol: 392 mg/dL — ABNORMAL HIGH (ref 0–200)
HDL: 19 mg/dL — ABNORMAL LOW (ref >40)
LDL Cholesterol: UNDETERMINED mg/dL (ref 0–99)
Triglycerides: 2693 mg/dL — ABNORMAL HIGH (ref <150)
VLDL: UNDETERMINED mg/dL (ref 0–40)

## 2020-05-02 LAB — LDL CHOLESTEROL, DIRECT: Direct LDL: <10 mg/dL (ref 0–99)

## 2020-05-02 LAB — HEMOGLOBIN A1C
Hgb A1c MFr Bld: 12 % — ABNORMAL HIGH (ref 4.8–5.6)
Mean Plasma Glucose: 297.7 mg/dL

## 2020-05-02 LAB — RESP PANEL BY RT-PCR (FLU A&B, COVID) ARPGX2
Influenza A by PCR: NEGATIVE
Influenza B by PCR: NEGATIVE
SARS Coronavirus 2 by RT PCR: NEGATIVE

## 2020-05-02 LAB — BRAIN NATRIURETIC PEPTIDE: B Natriuretic Peptide: 23.3 pg/mL (ref 0.0–100.0)

## 2020-05-02 IMAGING — DX DG CHEST 1V PORT
1 series · 1 of 1 positions shown · non-contrast
Comparison: Portable exam [W0] hours without priors for comparison

CLINICAL DATA: Sore chest post heart catheterization and stenting
last night, STEMI

EXAM:
PORTABLE CHEST 1 VIEW

[chest ap]
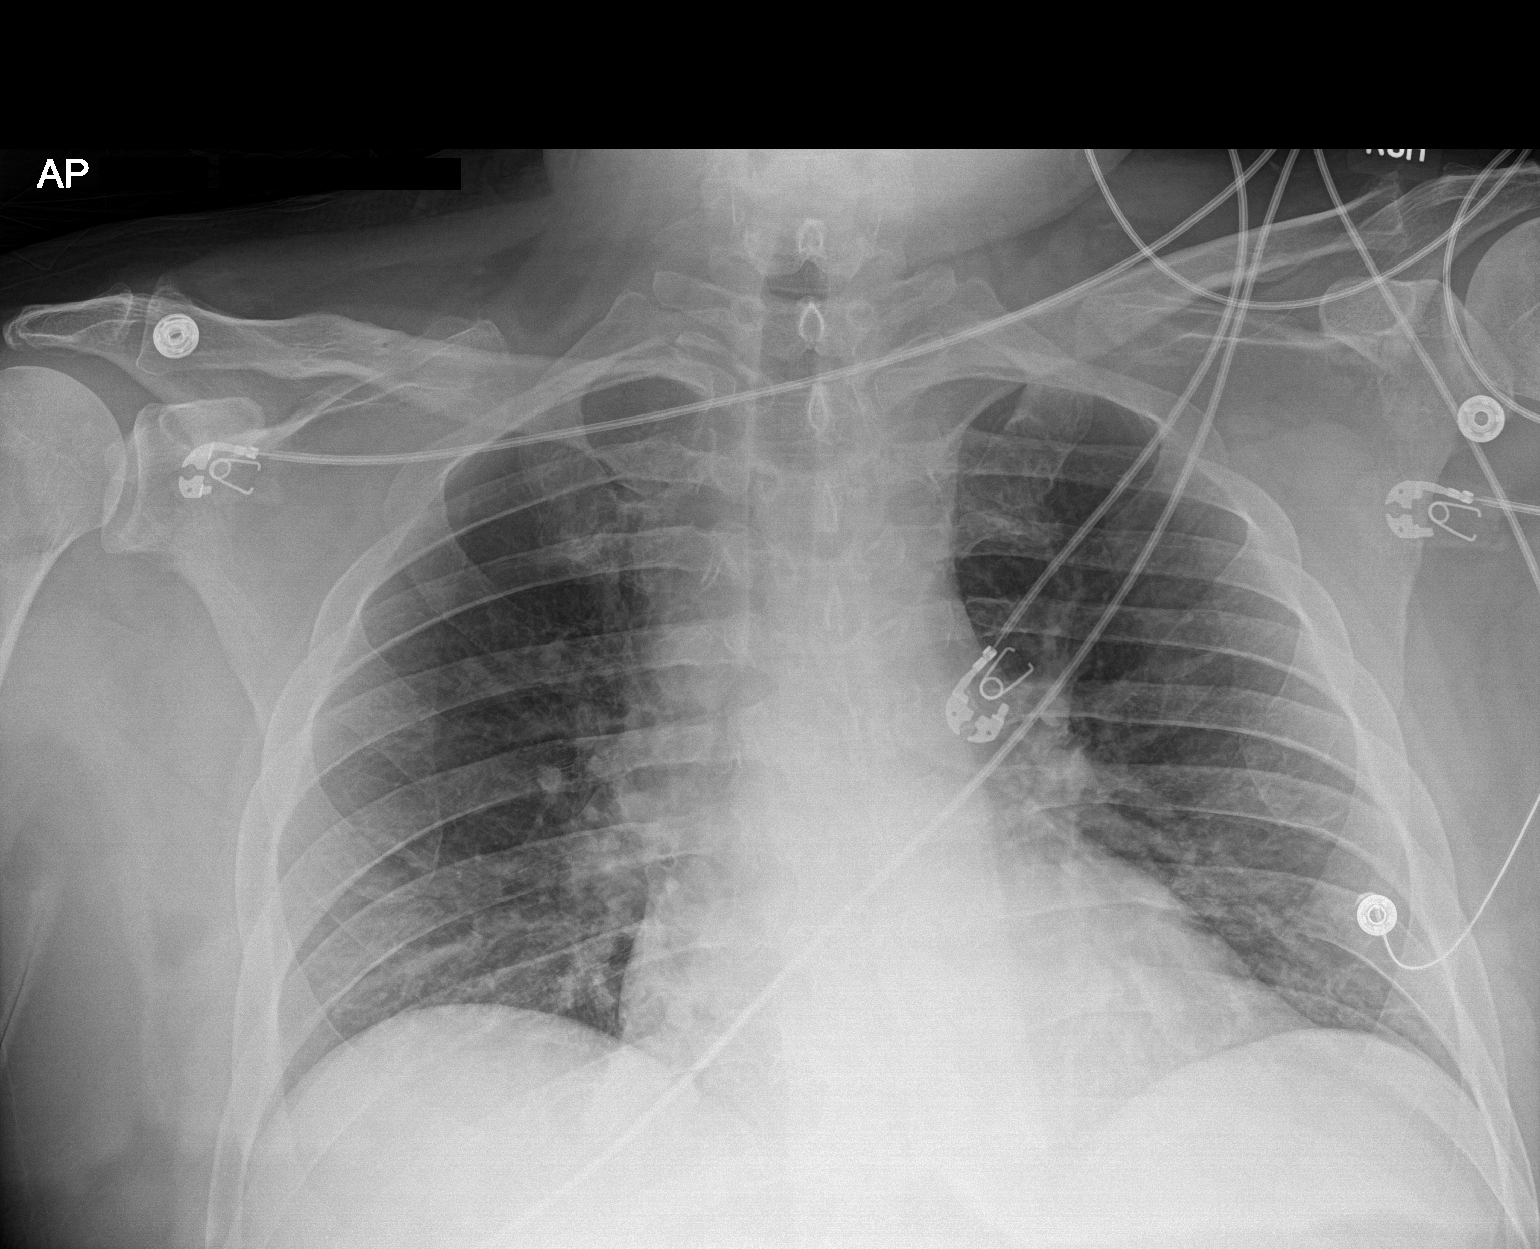

[1 of 1 positions shown; findings below may reference images not displayed]

FINDINGS: Normal heart size, mediastinal contours, and pulmonary vascularity.

Mild bibasilar atelectasis.

Upper lungs clear.

No pleural effusion or pneumothorax.

Osseous structures unremarkable.
IMPRESSION: Bibasilar atelectasis.

## 2020-05-02 MED ORDER — VERAPAMIL HCL 2.5 MG/ML IV SOLN
INTRAVENOUS | Status: DC | PRN
  Administered 2020-05-02: 10 mL via INTRA_ARTERIAL

## 2020-05-02 MED ORDER — METOPROLOL TARTRATE 12.5 MG HALF TABLET
12.5000 mg | ORAL_TABLET | Freq: Two times a day (BID) | ORAL | Status: DC
  Administered 2020-05-02 – 2020-05-04 (×5): 12.5 mg via ORAL
  Filled 2020-05-02 (×5): qty 1

## 2020-05-02 MED ORDER — TICAGRELOR 90 MG PO TABS
ORAL_TABLET | ORAL | Status: AC
  Filled 2020-05-02: qty 1

## 2020-05-02 MED ORDER — LABETALOL HCL 5 MG/ML IV SOLN
10.0000 mg | INTRAVENOUS | Status: AC | PRN
Start: 2020-05-02 — End: 2020-05-02

## 2020-05-02 MED ORDER — NITROGLYCERIN 0.4 MG SL SUBL: 0.4000 mg | SUBLINGUAL_TABLET | SUBLINGUAL | Status: DC | PRN

## 2020-05-02 MED ORDER — LISINOPRIL 10 MG PO TABS
10.0000 mg | ORAL_TABLET | Freq: Every day | ORAL | Status: DC
  Administered 2020-05-02: 10 mg via ORAL
  Filled 2020-05-02 (×2): qty 1

## 2020-05-02 MED ORDER — ACETAMINOPHEN 325 MG PO TABS: 650.0000 mg | ORAL_TABLET | ORAL | Status: DC | PRN

## 2020-05-02 MED ORDER — HYDRALAZINE HCL 20 MG/ML IJ SOLN: 10.0000 mg | INTRAMUSCULAR | Status: AC | PRN

## 2020-05-02 MED ORDER — INSULIN REGULAR(HUMAN) IN NACL 100-0.9 UT/100ML-% IV SOLN
INTRAVENOUS | Status: DC
  Administered 2020-05-02: 17 [IU]/h via INTRAVENOUS
  Filled 2020-05-02: qty 100

## 2020-05-02 MED ORDER — ENOXAPARIN SODIUM 40 MG/0.4ML ~~LOC~~ SOLN
40.0000 mg | Freq: Every day | SUBCUTANEOUS | Status: DC
  Administered 2020-05-02 – 2020-05-03 (×2): 40 mg via SUBCUTANEOUS
  Filled 2020-05-02 (×3): qty 0.4

## 2020-05-02 MED ORDER — TICAGRELOR 90 MG PO TABS: 90.0000 mg | ORAL_TABLET | Freq: Two times a day (BID) | ORAL | Status: DC

## 2020-05-02 MED ORDER — ICOSAPENT ETHYL 1 G PO CAPS
2.0000 g | ORAL_CAPSULE | Freq: Two times a day (BID) | ORAL | Status: DC
  Administered 2020-05-02 – 2020-05-04 (×6): 2 g via ORAL
  Filled 2020-05-02 (×7): qty 2

## 2020-05-02 MED ORDER — ONDANSETRON HCL 4 MG/2ML IJ SOLN
4.0000 mg | Freq: Four times a day (QID) | INTRAMUSCULAR | Status: DC | PRN
  Administered 2020-05-02 – 2020-05-04 (×7): 4 mg via INTRAVENOUS
  Filled 2020-05-02 (×7): qty 2

## 2020-05-02 MED ORDER — ONDANSETRON HCL 4 MG/2ML IJ SOLN: 4.0000 mg | Freq: Four times a day (QID) | INTRAMUSCULAR | Status: DC | PRN

## 2020-05-02 MED ORDER — INSULIN ASPART 100 UNIT/ML ~~LOC~~ SOLN
0.0000 [IU] | Freq: Three times a day (TID) | SUBCUTANEOUS | Status: DC
  Administered 2020-05-02: 7 [IU] via SUBCUTANEOUS
  Administered 2020-05-02: 4 [IU] via SUBCUTANEOUS
  Administered 2020-05-03 (×2): 11 [IU] via SUBCUTANEOUS
  Administered 2020-05-04: 3 [IU] via SUBCUTANEOUS

## 2020-05-02 MED ORDER — INSULIN DETEMIR 100 UNIT/ML ~~LOC~~ SOLN
50.0000 [IU] | Freq: Every day | SUBCUTANEOUS | Status: DC
  Filled 2020-05-02: qty 0.5

## 2020-05-02 MED ORDER — ASPIRIN EC 81 MG PO TBEC: 81.0000 mg | DELAYED_RELEASE_TABLET | Freq: Every day | ORAL | Status: DC

## 2020-05-02 MED ORDER — ASPIRIN 81 MG PO CHEW
81.0000 mg | CHEWABLE_TABLET | Freq: Every day | ORAL | Status: DC
  Administered 2020-05-02 – 2020-05-04 (×3): 81 mg via ORAL
  Filled 2020-05-02 (×3): qty 1

## 2020-05-02 MED ORDER — CHLORHEXIDINE GLUCONATE CLOTH 2 % EX PADS: 6.0000 | MEDICATED_PAD | Freq: Every day | CUTANEOUS | Status: DC

## 2020-05-02 MED ORDER — TICAGRELOR 90 MG PO TABS
ORAL_TABLET | ORAL | Status: DC | PRN
  Administered 2020-05-02: 180 mg via ORAL

## 2020-05-02 MED ORDER — ESCITALOPRAM OXALATE 10 MG PO TABS
20.0000 mg | ORAL_TABLET | Freq: Every day | ORAL | Status: DC
  Administered 2020-05-02 – 2020-05-04 (×3): 20 mg via ORAL
  Filled 2020-05-02 (×3): qty 2

## 2020-05-02 MED ORDER — FENOFIBRATE 160 MG PO TABS
160.0000 mg | ORAL_TABLET | Freq: Every day | ORAL | Status: DC
  Administered 2020-05-02 – 2020-05-04 (×3): 160 mg via ORAL
  Filled 2020-05-02 (×3): qty 1

## 2020-05-02 MED ORDER — TICAGRELOR 90 MG PO TABS
90.0000 mg | ORAL_TABLET | Freq: Two times a day (BID) | ORAL | Status: DC
  Administered 2020-05-02 – 2020-05-04 (×5): 90 mg via ORAL
  Filled 2020-05-02 (×5): qty 1

## 2020-05-02 MED ORDER — SODIUM CHLORIDE 0.9% FLUSH: 3.0000 mL | INTRAVENOUS | Status: DC | PRN

## 2020-05-02 MED ORDER — PANTOPRAZOLE SODIUM 40 MG PO TBEC
40.0000 mg | DELAYED_RELEASE_TABLET | Freq: Every day | ORAL | Status: DC
  Administered 2020-05-02 – 2020-05-04 (×3): 40 mg via ORAL
  Filled 2020-05-02 (×3): qty 1

## 2020-05-02 MED ORDER — IOHEXOL 350 MG/ML SOLN
INTRAVENOUS | Status: DC | PRN
  Administered 2020-05-02: 110 mL via INTRA_ARTERIAL

## 2020-05-02 MED ORDER — HEPARIN (PORCINE) IN NACL 1000-0.9 UT/500ML-% IV SOLN
INTRAVENOUS | Status: DC | PRN
  Administered 2020-05-02 (×2): 500 mL

## 2020-05-02 MED ORDER — DAPAGLIFLOZIN PROPANEDIOL 10 MG PO TABS: 10.0000 mg | ORAL_TABLET | Freq: Every day | ORAL | Status: DC

## 2020-05-02 MED ORDER — SODIUM CHLORIDE 0.9 % IV SOLN: 250.0000 mL | INTRAVENOUS | Status: DC | PRN

## 2020-05-02 MED ORDER — SODIUM CHLORIDE 0.9% FLUSH
3.0000 mL | Freq: Two times a day (BID) | INTRAVENOUS | Status: DC
  Administered 2020-05-02 – 2020-05-03 (×3): 3 mL via INTRAVENOUS

## 2020-05-02 MED ORDER — INSULIN DETEMIR 100 UNIT/ML ~~LOC~~ SOLN
50.0000 [IU] | Freq: Every day | SUBCUTANEOUS | Status: DC
  Administered 2020-05-02 – 2020-05-04 (×3): 50 [IU] via SUBCUTANEOUS
  Filled 2020-05-02 (×3): qty 0.5

## 2020-05-02 MED ORDER — DEXTROSE 50 % IV SOLN: 0.0000 mL | INTRAVENOUS | Status: DC | PRN

## 2020-05-02 MED ORDER — ALPRAZOLAM 0.25 MG PO TABS
0.2500 mg | ORAL_TABLET | Freq: Two times a day (BID) | ORAL | Status: DC | PRN
  Administered 2020-05-02 – 2020-05-04 (×3): 0.25 mg via ORAL
  Filled 2020-05-02 (×4): qty 1

## 2020-05-02 MED ORDER — ROSUVASTATIN CALCIUM 20 MG PO TABS
40.0000 mg | ORAL_TABLET | Freq: Every day | ORAL | Status: DC
  Administered 2020-05-02 – 2020-05-04 (×3): 40 mg via ORAL
  Filled 2020-05-02 (×3): qty 2

## 2020-05-02 MED ORDER — OMEGA-3-ACID ETHYL ESTERS 1 G PO CAPS
1000.0000 mg | ORAL_CAPSULE | Freq: Every day | ORAL | Status: DC
  Administered 2020-05-02: 1000 mg via ORAL
  Filled 2020-05-02: qty 1

## 2020-05-02 MED ORDER — SODIUM CHLORIDE 0.9 % IV SOLN: INTRAVENOUS | Status: AC

## 2020-05-02 NOTE — Progress Notes (Signed)
Date and time results received: 05/02/20 0820  Test: troponin  Critical Value: 21,317  Name of Provider Notified: none, expected results  Orders Received? Or Actions Taken?: none, expected results

## 2020-05-02 NOTE — Progress Notes (Addendum)
Inpatient Diabetes Program Recommendations  AACE/ADA: New Consensus Statement on Inpatient Glycemic Control (2015)  Target Ranges:  Prepandial:   less than 140 mg/dL      Peak postprandial:   less than 180 mg/dL (1-2 hours)      Critically ill patients:  140 - 180 mg/dL   Lab Results  Component Value Date   GLUCAP 155 (H) 05/02/2020   HGBA1C 12.0 (H) 05/02/2020    Review of Glycemic Control Results for Dan Ford, Dan Ford (MRN [id number]) as of 05/02/2020 12:31  Ref. Range 05/02/2020 08:40 05/02/2020 09:38 05/02/2020 10:28 05/02/2020 11:26  Glucose-Capillary Latest Ref Range: 70 - 99 mg/dL 867 (H) 672 (H) 094 (H) 155 (H)   Diabetes history: Type 2 Dm Outpatient Diabetes medications: Levemir 50 units QD Current orders for Inpatient glycemic control: IV insulin Levemir 50 units QHS, Novolog 0-20 units Q4H   Inpatient Diabetes Program Recommendations:    Noted plan for transition off IV insulin without basal. Consider changing administration time of Levemir 50 units to now. Secure chat sent to MD.  Anticipate insulin needs to be increased.   Spoke with patient regarding diabetes management. Patient is followed by PCP and at previous two visits patient attributes A1C elevations to steroid injections (Sep 2021) and steroids for bronchitis (Nov 2021).  Reviewed patient's current A1c of 12.0%. Explained what a A1c is and what it measures. Also reviewed goal A1c with patient, importance of good glucose control @ home, and blood sugar goals. Reviewed patho of DM, role of pancreas, need for insulin, signs and symptoms of hypo vs hyper, impact from cardiac perspective, impact of steroids, when to call MD, vascular changes and commorbidities.  Patient wears a Freestyle Libre 14 day sensor and reports checking fastings and 30 mins following meals. Denies issue with hypoglycemia. Fastings usually are >200 mg/dL. Encouraged to check 1.5-2 hours following meals to truly capture post prandial reading with  Freestyle. Patient does have meter and supplies if necessary. Reviewed when to call MD and will provide information on outpatient endocrinology, especially given history of pancreatitis.  Patient denies drinking sugary beverages and tries to be mindful of CHO intake. Encouraged to continue to be mindful. Reviewed daily alottment of CHO, plate method and incorporating protein. Dietitian and outpatient eduction referral placed. Patient has no further questions at this time.   Thanks, Lujean Rave, MSN, RNC-OB Diabetes Coordinator [phone] (8a-5p)

## 2020-05-02 NOTE — Progress Notes (Signed)
  Echocardiogram 2D Echocardiogram has been performed.  Janalyn Harder 05/02/2020, 11:11 AM

## 2020-05-02 NOTE — Progress Notes (Addendum)
Progress Note  Patient Name: Dan Ford Date of Encounter: 05/02/2020  [facility] Cardiologist: Lance Muss, MD   Subjective   Still having minimal precordial discomfort, which I explained is likely related to distal embolization of microcirculation.  This usually resolves over hours after revascularization by mechanical means in the setting of STEMI.  Denies dyspnea.  No sleep.  Has mixed hyperlipidemia with severely elevated triglycerides.  Already taking Tikosyn fentanyl Kasa/fenofibrate/high intensity rosuvastatin.  We will check with the advanced lipid clinic to see if there are any additional measures or studies that may be helpful.  Inpatient Medications    Scheduled Meds: . aspirin  81 mg Oral Daily  . enoxaparin (LOVENOX) injection  40 mg Subcutaneous Daily  . escitalopram  20 mg Oral Daily  . fenofibrate  160 mg Oral Daily  . heparin  4,000 Units Intravenous Once  . icosapent Ethyl  2 g Oral BID  . lisinopril  10 mg Oral Daily  . metoprolol tartrate  12.5 mg Oral BID  . omega-3 acid ethyl esters  1,000 mg Oral Daily  . pantoprazole  40 mg Oral Daily  . rosuvastatin  40 mg Oral Daily  . sodium chloride flush  3 mL Intravenous Q12H  . ticagrelor  90 mg Oral BID   Continuous Infusions: . sodium chloride 10 mL/hr at 05/02/20 0122  . sodium chloride    . insulin 5 mL/hr at 05/02/20 0600   PRN Meds: sodium chloride, acetaminophen, ALPRAZolam, dextrose, nitroGLYCERIN, ondansetron (ZOFRAN) IV, sodium chloride flush   Vital Signs    Vitals:   05/02/20 0600 05/02/20 0615 05/02/20 0630 05/02/20 0645  BP: 106/90 119/67 125/65 130/83  Pulse: 82 86 84 79  Resp: 18 16 19 16   Temp:      TempSrc:      SpO2: 94% 99% 98% 99%    Intake/Output Summary (Last 24 hours) at 05/02/2020 0740 Last data filed at 05/02/2020 0700 Gross per 24 hour  Intake 499.97 ml  Output 2000 ml  Net -1500.03 ml   No flowsheet data found.    Telemetry    Normal sinus  rhythm- Personally Reviewed  ECG    NSR with resolution of ST elevation and non-diagnostic inferolateral Q waves. - Personally Reviewed  Physical Exam  Morbid obesity GEN: No acute distress.   Neck: No JVD Cardiac: RRR, with S4 gallop.  No rub or murmur Respiratory: Clear to auscultation bilaterally. GI: Soft, nontender, non-distended  MS: No edema; No deformity. Neuro:  Nonfocal  Psych: Normal affect   Labs    High Sensitivity Troponin:   Recent Labs  Lab 05/02/20 0039  TROPONINIHS 4,155*      Chemistry Recent Labs  Lab 05/02/20 0039  NA 130*  K 3.7  CL 98  CO2 17*  GLUCOSE 308*  BUN 24*  CREATININE 0.71  CALCIUM 8.5*  PROT 5.5*  ALBUMIN 3.0*  AST 47*  ALT 22  ALKPHOS 84  BILITOT 1.0  GFRNONAA >60  ANIONGAP 15     Hematology Recent Labs  Lab 05/02/20 0039  WBC 8.5  RBC 3.76*  HGB 11.6*  HCT 32.9*  MCV 87.5  MCH 30.9  MCHC 35.3  RDW 12.6  PLT 239    BNP Recent Labs  Lab 05/02/20 0039  BNP 23.3     TG 2693; Total cholesterol 392; HDL 19  DDimer No results for input(s): DDIMER in the last 168 hours.   Radiology    CARDIAC CATHETERIZATION  Addendum  Date: 05/02/2020    Dist RCA lesion is 100% stenosed. A drug-eluting stent was successfully placed using a STENT RESOLUTE ONYX 2.5X18, postdilated to 3.0 mm in the proximal stent.  Post intervention, there is a 0% residual stenosis.  Ramus lesion is 25% stenosed. Large ramus and small circumflex.  Dist LAD lesion is 25% stenosed. Distal LAD tapers diffusely.  The left ventricular systolic function is normal.  The left ventricular ejection fraction is 55-65% by visual estimate. Basal inferior hypokinesis.  LV end diastolic pressure is normal. LVEDP 11 mm Hg.  There is no aortic valve stenosis.  A drug-eluting stent was successfully placed using a STENT RESOLUTE ONYX 2.5X18.  Continue aggressive secondary prevention with risk factor modification.  Finish current bag of aggrastat and then  stop.  Watch in ICU. Dual antiplatelet therapy for 12 months.  High dose statin.  Will refer to our pharmD lipid clinic to see what  Other therapies or trials may be helpful for TG lowering.   Result Date: 05/02/2020  Dist RCA lesion is 100% stenosed. A drug-eluting stent was successfully placed using a STENT RESOLUTE ONYX 2.5X18, postdilated to 3.0 mm in the proximal stent.  Post intervention, there is a 0% residual stenosis.  Ramus lesion is 25% stenosed. Large ramus and small circumflex.  Dist LAD lesion is 25% stenosed. Distal LAD tapers diffusely.  The left ventricular systolic function is normal.  The left ventricular ejection fraction is 55-65% by visual estimate. Basal inferior hypokinesis.  LV end diastolic pressure is normal. LVEDP 11 mm Hg.  There is no aortic valve stenosis.  A drug-eluting stent was successfully placed using a STENT RESOLUTE ONYX 2.5X18.  Continue aggressive secondary prevention with risk factor modification.  Dual antiplatelet therapy for 12 months.  High dose statin.  Will refer to our pharmD lipid clinic to see what  Other therapies or trials may be helpful for TG lowering.    Cardiac Studies   CARDIAC CATH/PCI 05/01/2020: Diagnostic Dominance: Right    Intervention      Patient Profile     50 y.o. male T2DM on insulin, mixed hyperlipidemia with severe hypertriglyceridemia and pancreatitis, anxiety disorder presenting with acute chest pain and inferior STEMI.  Assessment & Plan    1. Coronary artery disease with inferior ST elevation MI: Treated with mechanical revascularization and stenting.  Good angiographic result which was personally reviewed.  Aspirin and Brilinta x12 months.  Will uptitrate beta-blocker regimen.  Continue angiotensin converting enzyme inhibitor therapy. 2. Severe mixed hyperlipidemia: Currently on three drug regimen.  Will probably be worthwhile to have him seen in the advanced lipid clinic with Dr. Rennis Golden.  He is already on what  appears to be maximum therapy however. 3. Primary hypertension: Target 130/80 4. Obstructive sleep apnea: Encourage CPAP 5. Diabetes mellitus type II:.  Hemoglobin A1c is 12.  Clearly has dietary control issues.  We will discontinue IV insulin, revert to sliding scale and usual Levemir.  Will add SGLT2, Farxiga 10 mg daily for cardioprotection, diastolic heart failure, and kidney protection. 6. Acute on chronic diastolic heart failure: Start dapagliflozin.  We will hold initiation until ketoacidosis clears.  For questions or updates, please contact [facility] Please consult [email or web address] for contact info under        Signed, Lesleigh Noe, MD  05/02/2020, 7:40 AM

## 2020-05-02 NOTE — Progress Notes (Signed)
CARDIAC REHAB PHASE I   PRE:  Rate/Rhythm: 73 SR  BP:  Supine: 116/66  Sitting:   Standing:    SaO2: 98 RA  MODE:  Ambulation: 380 ft   POST:  Rate/Rhythm: 93 SR  BP:  Supine:   Sitting: 109/70  Standing:    SaO2: 100 RA  Pt tolerated exercise well and amb 380 ft with standby assist and no assistive device. Some slight dizziness at the end of walk, pt took a break and feeling discontinued. Education given to pt and wife on Brilinta, aspirin and NTG adherence. Enforced radial restrictions, heart heathy diet, MI booklet, and home exercise guidelines.[place] cardiac phase 2 referral done. Pt voiced understanding and all questions were answered. Pt left in recliner with call bell in reach and wife in the room.   [phone] Harrie Jeans ACSM-EP 05/02/2020 11:57 AM

## 2020-05-02 NOTE — Progress Notes (Signed)
Went to see if [facility] family had gotten any news yet.  They were very relieved that one stent had given tremendous relief to patient.  They were very happy and eager to see patient when available. Family was very relieved and thankful.  05/02/20 0000  Clinical Encounter Type  Visited With Family  Visit Type Follow-up  Consult/Referral To Chaplain  Spiritual Encounters  Spiritual Needs  (check status of patient)

## 2020-05-02 NOTE — Care Management (Signed)
05-02-20 1207 Benefits check submitted for Brilinta and Farxiga. Case Manager will follow for cost. Graves-Bigelow, Lamar Laundry, RN,BSN Case Manager

## 2020-05-02 NOTE — TOC Benefit Eligibility Note (Signed)
Transition of Care [facility]) Benefit Eligibility Note    Patient Details  Name: Dan Ford MRN: [id number] Date of Birth: 10/30/1970   Medication/Dose: BRILINTA  90 MG BID    and   TICAGRELOR-NON-FORMULARY  Covered?: Yes  Tier: 3 Drug  Prescription Coverage [facility]  Spoke with Person/Company/Phone Number:: DAVINE   @  [facility] #  [phone]  Co-Pay: $40.00  Prior Approval: No  Deductible: Unmet (OUT-OF-POCKET:UNMET)  Additional Notes: FARXIGA  5 MG  DAILY  : NOT COVER ,  P/A -YES # [phone]    Mardene Sayer Phone Number: 05/02/2020, 1:14 PM

## 2020-05-02 NOTE — Progress Notes (Signed)
Pt transferred to 6E25 via wheelchair on tele monitoring with RN. Pt's VSS and tolerated transfer well, despite nausea. Pt assisted to bed and received by RN.

## 2020-05-03 DIAGNOSIS — G4733 Obstructive sleep apnea (adult) (pediatric): Secondary | ICD-10-CM

## 2020-05-03 DIAGNOSIS — I213 ST elevation (STEMI) myocardial infarction of unspecified site: Secondary | ICD-10-CM

## 2020-05-03 DIAGNOSIS — E782 Mixed hyperlipidemia: Secondary | ICD-10-CM

## 2020-05-03 DIAGNOSIS — E1165 Type 2 diabetes mellitus with hyperglycemia: Secondary | ICD-10-CM

## 2020-05-03 LAB — GLUCOSE, CAPILLARY
Glucose-Capillary: 252 mg/dL — ABNORMAL HIGH (ref 70–99)
Glucose-Capillary: 100 mg/dL — ABNORMAL HIGH (ref 70–99)
Glucose-Capillary: 134 mg/dL — ABNORMAL HIGH (ref 70–99)

## 2020-05-03 LAB — BASIC METABOLIC PANEL
Anion gap: 8 (ref 5–15)
BUN: 11 mg/dL (ref 6–20)
CO2: 24 mmol/L (ref 22–32)
Calcium: 8.4 mg/dL — ABNORMAL LOW (ref 8.9–10.3)
Chloride: 101 mmol/L (ref 98–111)
Creatinine, Ser: 0.68 mg/dL (ref 0.61–1.24)
GFR, Estimated: >60 mL/min (ref >60)
Glucose, Bld: 229 mg/dL — ABNORMAL HIGH (ref 70–99)
Potassium: 3.9 mmol/L (ref 3.5–5.1)
Sodium: 133 mmol/L — ABNORMAL LOW (ref 135–145)

## 2020-05-03 LAB — BRAIN NATRIURETIC PEPTIDE: B Natriuretic Peptide: 52.6 pg/mL (ref 0.0–100.0)

## 2020-05-03 MED ORDER — DAPAGLIFLOZIN PROPANEDIOL 10 MG PO TABS
10.0000 mg | ORAL_TABLET | Freq: Every day | ORAL | Status: DC
  Administered 2020-05-03 – 2020-05-04 (×2): 10 mg via ORAL
  Filled 2020-05-03 (×2): qty 1

## 2020-05-03 MED ORDER — LISINOPRIL 10 MG PO TABS: 10.0000 mg | ORAL_TABLET | Freq: Every day | ORAL | Status: DC

## 2020-05-03 NOTE — Progress Notes (Signed)
Inpatient Diabetes Program Recommendations  AACE/ADA: New Consensus Statement on Inpatient Glycemic Control (2015)  Target Ranges:  Prepandial:   less than 140 mg/dL      Peak postprandial:   less than 180 mg/dL (1-2 hours)      Critically ill patients:  140 - 180 mg/dL   Lab Results  Component Value Date   GLUCAP 269 (H) 05/02/2020   HGBA1C 12.0 (H) 05/02/2020    Review of Glycemic Control Results for Dan Ford, Dan Ford (MRN [id number]) as of 05/03/2020 10:05  Ref. Range 05/02/2020 10:28 05/02/2020 11:26 05/02/2020 16:20 05/02/2020 21:22  Glucose-Capillary Latest Ref Range: 70 - 99 mg/dL 473 (H) 403 (H) 709 (H) 269 (H)  Diabetes history: Type 2 Dm Outpatient Diabetes medications: Levemir 50 units QD Current orders for Inpatient glycemic control: Levemir 50 units QHS, Novolog 0-20 units TID   Inpatient Diabetes Program Recommendations:    Consider: -Increasing Levemir to 54 units QD -Adding Novolog 5 units TID (assuming patient is consuming >50% of meals) -Adding Novolog 0-5 units QHS  Thanks, Lujean Rave, MSN, RNC-OB Diabetes Coordinator [phone] (8a-5p)

## 2020-05-03 NOTE — Plan of Care (Signed)
Nutrition Education Note  RD consulted for nutrition education regarding CHF and diabetes.  Spoke with pt and wife at bedside. Both report pt with good appetite, however, has been experiencing some nausea with meals, which he attributes to recent MI. Pt reports that he has been doing well with DM and is comfortable going home on insulin. He has seen improvements since using [place] meter. Per wife, outpatient appointment with [facility] has already been arranged for the end of the month.   Pt denies any further nutrition-related questions, however, wanted RD input on menu options. RD reviewed hospital menu and discussed healthier options. Pt and wife very appreciative.   RD provided "Heart Healthy, Consistent Carbohydrate Nutrition Therapy" handout from the Academy of [employer] and Dietetics. Reviewed patient's dietary recall. Provided examples on ways to decrease sodium intake in diet. Discouraged intake of processed foods and use of salt shaker. Encouraged fresh fruits and vegetables as well as whole grain sources of carbohydrates to maximize fiber intake.   RD discussed why it is important for patient to adhere to diet recommendations, and emphasized the role of fluids, foods to avoid, and importance of weighing self daily.   Discussed different food groups and their effects on blood sugar, emphasizing carbohydrate-containing foods. Provided list of carbohydrates and recommended serving sizes of common foods.  Discussed importance of controlled and consistent carbohydrate intake throughout the day. Provided examples of ways to balance meals/snacks and encouraged intake of high-fiber, whole grain complex carbohydrates. Teach back method used.  Expect fair to good compliance.  Current diet order is carb modified, patient is consuming approximately 100% of meals at this time. Labs and medications reviewed. No further nutrition interventions warranted at this time. RD contact information provided. If  additional nutrition issues arise, please re-consult RD.  Levada Schilling, RD, LDN, CDCES Registered Dietitian II Certified Diabetes Care and Education Specialist Please refer to [facility] for RD and/or RD on-call/weekend/after hours pager

## 2020-05-03 NOTE — TOC Benefit Eligibility Note (Signed)
Patient Advocate Encounter  Prior Authorization for Farxiga 10 mg has been approved.    PA# [id number] Effective dates: 05/03/2020 through 05/03/2023  Patients co-pay is $15.00.     Roland Earl, CPhT Pharmacy Patient Advocate Specialist [place] [facility] Stewardship Team Direct Number: [phone]  Fax: [phone]

## 2020-05-03 NOTE — Progress Notes (Signed)
CARDIAC REHAB PHASE I   PRE:  Rate/Rhythm: 82 SR  BP:  Supine: 117/91  Sitting:   Standing:    SaO2: 100 RA  MODE:  Ambulation: 470 ft   POST:  Rate/Rhythm: 91 SR  BP:  Supine:   Sitting: 131/78  Standing:    SaO2: 97 RA  Pt tolerated exercise well with standby assist and amb 470 ft. Pt c/o mild nausea and fatigue that has been ongoing, one rest break taken with no c/o SOB, CP or pain. Pt left in bed with call bell in reach and wife in the room.   [id number] Dan Ford ACSM-EP 05/03/2020 10:58 AM

## 2020-05-03 NOTE — Progress Notes (Signed)
Progress Note  Patient Name: Dan Ford Date of Encounter: 05/03/2020  [facility] Cardiologist: Lance Muss, MD   Subjective   Background chest discomfort has completely resolved overnight.  Feels better than yesterday but concerned about low blood pressures last evening in the setting of lisinopril and low-dose metoprolol.  Inpatient Medications    Scheduled Meds: . aspirin  81 mg Oral Daily  . enoxaparin (LOVENOX) injection  40 mg Subcutaneous Daily  . escitalopram  20 mg Oral Daily  . fenofibrate  160 mg Oral Daily  . icosapent Ethyl  2 g Oral BID  . insulin aspart  0-20 Units Subcutaneous TID WC  . insulin detemir  50 Units Subcutaneous Daily  . [START ON 05/04/2020] lisinopril  10 mg Oral Daily  . metoprolol tartrate  12.5 mg Oral BID  . pantoprazole  40 mg Oral Daily  . rosuvastatin  40 mg Oral Daily  . sodium chloride flush  3 mL Intravenous Q12H  . ticagrelor  90 mg Oral BID   Continuous Infusions: . sodium chloride 10 mL/hr at 05/02/20 0122  . sodium chloride     PRN Meds: sodium chloride, acetaminophen, ALPRAZolam, nitroGLYCERIN, ondansetron (ZOFRAN) IV, sodium chloride flush   Vital Signs    Vitals:   05/02/20 1440 05/02/20 1958 05/03/20 0442 05/03/20 0957  BP: 95/63 94/61 105/76 118/73  Pulse: 79 79 83 84  Resp: 16 16 18    Temp: 98 F (36.7 C) 97.9 F (36.6 C) 97.7 F (36.5 C)   TempSrc: Oral Oral Oral   SpO2: 98% 99% 100%     Intake/Output Summary (Last 24 hours) at 05/03/2020 1029 Last data filed at 05/03/2020 1000 Gross per 24 hour  Intake 1084.88 ml  Output 2225 ml  Net -1140.12 ml   No flowsheet data found.    Telemetry    Normal sinus rhythm- Personally Reviewed  ECG    No new tracing- Personally Reviewed  Physical Exam  Morbid obesity GEN: No acute distress.   Neck: No JVD Cardiac: RRR, with S4 gallop.  No rub or murmur Respiratory: Clear to auscultation bilaterally. GI: Soft, nontender, non-distended  MS: No  edema; No deformity.  The right radial cath site is unremarkable. Neuro:  Nonfocal  Psych: Normal affect   Labs    High Sensitivity Troponin:   Recent Labs  Lab 05/02/20 0039 05/02/20 0619  TROPONINIHS 4,155* 21,317*      Chemistry Recent Labs  Lab 05/01/20 2337 05/02/20 0039  NA 133* 130*  K 3.2* 3.7  CL 100 98  CO2  --  17*  GLUCOSE 302* 308*  BUN 23* 24*  CREATININE 0.60* 0.71  CALCIUM  --  8.5*  PROT  --  5.5*  ALBUMIN  --  3.0*  AST  --  47*  ALT  --  22  ALKPHOS  --  84  BILITOT  --  1.0  GFRNONAA  --  >60  ANIONGAP  --  15     Hematology Recent Labs  Lab 05/01/20 2337 05/02/20 0039  WBC  --  8.5  RBC  --  3.76*  HGB 11.2* 11.6*  HCT 33.0* 32.9*  MCV  --  87.5  MCH  --  30.9  MCHC  --  35.3  RDW  --  12.6  PLT  --  239    BNP Recent Labs  Lab 05/02/20 0039  BNP 23.3     TG 2693; Total cholesterol 392; HDL 19  DDimer No  results for input(s): DDIMER in the last 168 hours.   Radiology    CARDIAC CATHETERIZATION  Addendum Date: 05/02/2020    Dist RCA lesion is 100% stenosed. A drug-eluting stent was successfully placed using a STENT RESOLUTE ONYX 2.5X18, postdilated to 3.0 mm in the proximal stent.  Post intervention, there is a 0% residual stenosis.  Ramus lesion is 25% stenosed. Large ramus and small circumflex.  Dist LAD lesion is 25% stenosed. Distal LAD tapers diffusely.  The left ventricular systolic function is normal.  The left ventricular ejection fraction is 55-65% by visual estimate. Basal inferior hypokinesis.  LV end diastolic pressure is normal. LVEDP 11 mm Hg.  There is no aortic valve stenosis.  A drug-eluting stent was successfully placed using a STENT RESOLUTE ONYX 2.5X18.  Continue aggressive secondary prevention with risk factor modification.  Finish current bag of aggrastat and then stop.  Watch in ICU. Dual antiplatelet therapy for 12 months.  High dose statin.  Will refer to our pharmD lipid clinic to see what  Other  therapies or trials may be helpful for TG lowering.   Result Date: 05/02/2020  Dist RCA lesion is 100% stenosed. A drug-eluting stent was successfully placed using a STENT RESOLUTE ONYX 2.5X18, postdilated to 3.0 mm in the proximal stent.  Post intervention, there is a 0% residual stenosis.  Ramus lesion is 25% stenosed. Large ramus and small circumflex.  Dist LAD lesion is 25% stenosed. Distal LAD tapers diffusely.  The left ventricular systolic function is normal.  The left ventricular ejection fraction is 55-65% by visual estimate. Basal inferior hypokinesis.  LV end diastolic pressure is normal. LVEDP 11 mm Hg.  There is no aortic valve stenosis.  A drug-eluting stent was successfully placed using a STENT RESOLUTE ONYX 2.5X18.  Continue aggressive secondary prevention with risk factor modification.  Dual antiplatelet therapy for 12 months.  High dose statin.  Will refer to our pharmD lipid clinic to see what  Other therapies or trials may be helpful for TG lowering.   Portable chest x-ray 1 view  Result Date: 05/02/2020 CLINICAL DATA:  Sore chest post heart catheterization and stenting last night, STEMI EXAM: PORTABLE CHEST 1 VIEW COMPARISON:  Portable exam 0540 hours without priors for comparison FINDINGS: Normal heart size, mediastinal contours, and pulmonary vascularity. Mild bibasilar atelectasis. Upper lungs clear. No pleural effusion or pneumothorax. Osseous structures unremarkable. IMPRESSION: Bibasilar atelectasis. Electronically Signed   By: Ulyses SouthwardMark  Boles M.D.   On: 05/02/2020 08:11   ECHOCARDIOGRAM COMPLETE  Result Date: 05/02/2020    ECHOCARDIOGRAM REPORT   Patient Name:   Dan ChristenSTEPHEN M Ford Date of Exam: 05/02/2020 Medical Rec #:  [id number]         Height: Accession #:    [id number]        Weight: Date of Birth:  1970/11/17         BSA: Patient Age:    50 years          BP:           140/72 mmHg Patient Gender: M                 HR:           57 bpm. Exam Location:  Inpatient Procedure: 2D  Echo, 3D Echo, Cardiac Doppler and Color Doppler Indications:    121-121.4 ST elevation (STEMI) and non-ST elevation (NSTEMI)                 myocardial infarction  History:        Patient has no prior history of Echocardiogram examinations.                 Acute MI and CAD; Risk Factors:Hypertension, Diabetes and                 Dyslipidemia.  Sonographer:    Sheralyn Boatman RDCS Referring Phys: [id number] CARRIEL T NIPP  Sonographer Comments: Image acquisition challenging due to patient body habitus. IMPRESSIONS  1. Left ventricular ejection fraction, by estimation, is 55 to 60%. The left ventricle has normal function. The left ventricle demonstrates regional wall motion abnormalities with basal inferior severe hypokinesis. There is mild left ventricular hypertrophy. Left ventricular diastolic parameters are consistent with Grade I diastolic dysfunction (impaired relaxation).  2. Right ventricular systolic function is normal. The right ventricular size is normal. There is normal pulmonary artery systolic pressure. The estimated right ventricular systolic pressure is 14.7 mmHg.  3. The mitral valve is normal in structure. Trivial mitral valve regurgitation. No evidence of mitral stenosis.  4. The aortic valve is tricuspid. Aortic valve regurgitation is not visualized. Mild aortic valve sclerosis is present, with no evidence of aortic valve stenosis.  5. The inferior vena cava is dilated in size with >50% respiratory variability, suggesting right atrial pressure of 8 mmHg. FINDINGS  Left Ventricle: Left ventricular ejection fraction, by estimation, is 55 to 60%. The left ventricle has normal function. The left ventricle demonstrates regional wall motion abnormalities. The left ventricular internal cavity size was normal in size. There is mild left ventricular hypertrophy. Left ventricular diastolic parameters are consistent with Grade I diastolic dysfunction (impaired relaxation). Right Ventricle: The right ventricular size  is normal. No increase in right ventricular wall thickness. Right ventricular systolic function is normal. There is normal pulmonary artery systolic pressure. The tricuspid regurgitant velocity is 1.71 m/s, and  with an assumed right atrial pressure of 3 mmHg, the estimated right ventricular systolic pressure is 14.7 mmHg. Left Atrium: Left atrial size was normal in size. Right Atrium: Right atrial size was normal in size. Pericardium: There is no evidence of pericardial effusion. Mitral Valve: The mitral valve is normal in structure. Trivial mitral valve regurgitation. No evidence of mitral valve stenosis. Tricuspid Valve: The tricuspid valve is normal in structure. Tricuspid valve regurgitation is trivial. Aortic Valve: The aortic valve is tricuspid. Aortic valve regurgitation is not visualized. Mild aortic valve sclerosis is present, with no evidence of aortic valve stenosis. Pulmonic Valve: The pulmonic valve was normal in structure. Pulmonic valve regurgitation is trivial. Aorta: The aortic root is normal in size and structure. Venous: The inferior vena cava is dilated in size with greater than 50% respiratory variability, suggesting right atrial pressure of 8 mmHg. IAS/Shunts: No atrial level shunt detected by color flow Doppler.  LEFT VENTRICLE PLAX 2D LVIDd:         4.40 cm      Diastology LVIDs:         3.00 cm      LV e' medial:    6.20 cm/s LV PW:         1.70 cm      LV E/e' medial:  15.1 LV IVS:        1.30 cm      LV e' lateral:   7.62 cm/s LVOT diam:     2.30 cm      LV E/e' lateral: 12.3 LV SV:  72 LVOT Area:     4.15 cm  LV Volumes (MOD) LV vol d, MOD A2C: 65.1 ml LV vol d, MOD A4C: 106.0 ml LV vol s, MOD A2C: 27.0 ml LV vol s, MOD A4C: 45.8 ml LV SV MOD A2C:     38.1 ml LV SV MOD A4C:     106.0 ml LV SV MOD BP:      49.8 ml RIGHT VENTRICLE             IVC RV S prime:     14.00 cm/s  IVC diam: 2.10 cm TAPSE (M-mode): 2.1 cm LEFT ATRIUM             RIGHT ATRIUM LA diam:        3.80 cm RA  Area:     12.50 cm LA Vol (A2C):   39.8 ml RA Volume:   25.70 ml LA Vol (A4C):   34.5 ml LA Biplane Vol: 38.4 ml  AORTIC VALVE LVOT Vmax:   106.00 cm/s LVOT Vmean:  67.700 cm/s LVOT VTI:    0.173 m  AORTA Ao Root diam: 3.30 cm Ao Asc diam:  3.20 cm MITRAL VALVE               TRICUSPID VALVE MV Area (PHT): 3.99 cm    TR Peak grad:   11.7 mmHg MV Decel Time: 190 msec    TR Vmax:        171.00 cm/s MV E velocity: 93.80 cm/s MV A velocity: 96.00 cm/s  SHUNTS MV E/A ratio:  0.98        Systemic VTI:  0.17 m                            Systemic Diam: 2.30 cm Marca Ancona MD Electronically signed by Marca Ancona MD Signature Date/Time: 05/02/2020/3:42:31 PM    Final     Cardiac Studies   CARDIAC CATH/PCI 05/01/2020: Diagnostic Dominance: Right    Intervention      Patient Profile     50 y.o. male T2DM on insulin, mixed hyperlipidemia with severe hypertriglyceridemia and pancreatitis, anxiety disorder presenting with acute chest pain and inferior STEMI.  Assessment & Plan    1. Coronary artery disease with inferior ST elevation MI: Continue aspirin and Brilinta x12 months.  It would be best to continue using Brilinta in this patient since he is diabetic and there is improved outcomes on Brilinta in this subgroup.  Work with cardiac rehab today.  Anticipate discharge in a.m. 2. Severe mixed hyperlipidemia: Continue multidrug regimen.  Consider advanced lipid clinic referral.   3. Primary hypertension: Target 130/80.  Had some hypotension yesterday likely related to relative volume depletion from contrast-induced diuresis.  Change to Toprol-XL to 12.5 mg/day.  Give lisinopril in the evening. 4. Obstructive sleep apnea: Slept well last night with CPAP. 5. Diabetes mellitus type II:.  Farxiga 10 mg/day will be started. 6. Acute on chronic diastolic heart failure: Repeat basic metabolic panel today.  Check BNP.  LVEF 55 to 60%.  For questions or updates, please contact [facility] Please consult  [email or web address] for contact info under        Signed, Lesleigh Noe, MD  05/03/2020, 10:29 AM

## 2020-05-03 NOTE — Discharge Instructions (Signed)
Local Endocrinologists [facility] [phone]) 1. Dr. Carlus Pavlovristina Gherghe 2. Dr. Reather LittlerAjay Kumar 3. Dr Blanchie DessertShamleffer   [facility] [phone] 1. Dr. Talmage CoinJeffrey Kerr   [facility] [phone]) 1. Dr. Dorisann FramesBindubal Balan  2. Dr. Darci NeedleWalter Kohut [facility] [phone] 1. Dr. Deirdre PippinsStephen [facility] [phone] [[place] office]  [phone] [[place] office] 1. Dr. Efraim KaufmannMelissa Solum 2. Dr. Gelene Minkhomas OConnell [facility]) [phone]) 1. Autumn Hudnall Yetta Barre(Jones), [place] 2. Dr. Izell Carolinahaval Patel 3. Dr. Jillyn LedgerWilliam Smith Jr. [facility] [phone]) 1. Dr. Marquis LunchGebre Nida [facility] [phone] 1. Dr. Jerelyn ScottMicheal Brennan 2. Dr. Dessa PhiJennifer Badik 3. Dr. Judene CompanionAshley Jessup 4. Barron AlvineSpencer Beasley, FNP Dr. Girtha HakeMonica E. Doerr [place] [phone]).  Information about your medication: Brilinta (anti-platelet agent)  Generic Name (Brand): ticagrelor (Brilinta), twice daily medication  PURPOSE: You are taking this medication along with aspirin to lower your chance of having a heart attack, stroke, or blood clots in your heart stent. These can be fatal. Brilinta and aspirin help prevent platelets from sticking together and forming a clot that can block an artery or your stent.   Common SIDE EFFECTS you may experience include: bruising or bleeding more easily, shortness of breath  Do not stop taking BRILINTA without talking to the doctor who prescribes it for you. People who are treated with a stent and stop taking Brilinta too soon, have a higher risk of getting a blood clot in the stent, having a heart attack, or dying. If you stop Brilinta because of bleeding, or for other reasons, your risk of a heart attack or stroke may increase.   Tell all of your doctors and dentists that you are taking Brilinta. They should talk to the doctor who prescribed Brilinta for you  before you have any surgery or invasive procedure.   Contact your health care provider if you experience: severe or uncontrollable bleeding, pink/red/brown urine, vomiting blood or vomit that looks like "coffee grounds", red or black stools (looks like tar), coughing up blood or blood clots ----------------------------------------------------------------------------------------------------------------------  Heart Healthy, Consistent Carbohydrate Nutrition Therapy   A heart-healthy and consistent carbohydrate diet is recommended to manage heart disease and diabetes. To follow a heart-healthy and consistent carbohydrate diet,  Eat a balanced diet with whole grains, fruits and vegetables, and lean protein sources.   Choose heart-healthy unsaturated fats. Limit saturated fats, trans fats, and cholesterol intake. Eat more plant-based or vegetarian meals using beans and soy foods for protein.   Eat whole, unprocessed foods to limit the amount of sodium (salt) you eat.   Choose a consistent amount of carbohydrate at each meal and snack. Limit refined carbohydrates especially sugar, sweets and sugar-sweetened beverages.   If you drink alcohol, do so in moderation: one serving per day (women) and two servings per day (men). o One serving is equivalent to 12 ounces beer, 5 ounces wine, or 1.5 ounces distilled spirits  Tips Tips for Choosing Heart-Healthy Fats Choose lean protein and low-fat dairy foods to reduce saturated fat intake.  Saturated fat is usually found in animal-based protein and is associated with certain health risks. Saturated fat is the biggest contributor to raise low-density lipoprotein (LDL) cholesterol levels. Research shows that limiting saturated fat lowers unhealthy cholesterol levels. Eat no more than 7% of your total calories each day from saturated fat. Ask your RDN to help you determine how much saturated fat is right for you.  There are many foods that do not contain  large  amounts of saturated fats. Swapping these foods to replace foods high in saturated fats will help you limit the saturated fat you eat and improve your cholesterol levels. You can also try eating more plant-based or vegetarian meals. Instead of Try:  Whole milk, cheese, yogurt, and ice cream 1% or skim milk, low-fat cheese, non-fat yogurt, and low-fat ice cream  Fatty, marbled beef and pork Lean beef, pork, or venison  Poultry with skin Poultry without skin  Butter, stick margarine Reduced-fat, whipped, or liquid spreads  Coconut oil, palm oil Liquid vegetable oils: corn, canola, olive, soybean and safflower oils   Avoid foods that contain trans fats.  Trans fats increase levels of LDL-cholesterol. Hydrogenated fat in processed foods is the main source of trans fats in foods.   Trans fats can be found in stick margarine, shortening, processed sweets, baked goods, some fried foods, and packaged foods made with hydrogenated oils. Avoid foods with partially hydrogenated oil on the ingredient list such as: cookies, pastries, baked goods, biscuits, crackers, microwave popcorn, and frozen dinners. Choose foods with heart healthy fats.  Polyunsaturated and monounsaturated fat are unsaturated fats that may help lower your blood cholesterol level when used in place of saturated fat in your diet.  Ask your RDN about taking a dietary supplement with plant sterols and stanols to help lower your cholesterol level.  Research shows that substituting saturated fats with unsaturated fats is beneficial to cholesterol levels. Try these easy swaps: Instead of Try:  Butter, stick margarine, or solid shortening Reduced-fat, whipped, or liquid spreads  Beef, pork, or poultry with skin Fish and seafood  Chips, crackers, snack foods Raw or unsalted nuts and seeds or nut butters Hummus with vegetables Avocado on toast  Coconut oil, palm oil Liquid vegetable oils: corn, canola, olive, soybean and safflower  oils  Limit the amount of cholesterol you eat to less than 200 milligrams per day.  Cholesterol is a substance carried through the bloodstream via lipoproteins, which are known as transporters of fat. Some body functions need cholesterol to work properly, but too much cholesterol in the bloodstream can damage arteries and build up blood vessel linings (which can lead to heart attack and stroke). You should eat less than 200 milligrams cholesterol per day.  People respond differently to eating cholesterol. There is no test available right now that can figure out which people will respond more to dietary cholesterol and which will respond less. For individuals with high intake of dietary cholesterol, different types of increase (none, small, moderate, large) in LDL-cholesterol levels are all possible.   Food sources of cholesterol include egg yolks and organ meats such as liver, gizzards. Limit egg yolks to two to four per week and avoid organ meats like liver and gizzards to control cholesterol intake. Tips for Choosing Heart-Healthy Carbohydrates Consume a consistent amount of carbohydrate  It is important to eat foods with carbohydrates in moderation because they impact your blood glucose level. Carbohydrates can be found in many foods such as:  Grains (breads, crackers, rice, pasta, and cereals)   Starchy Vegetables (potatoes, corn, and peas)   Beans and legumes   Milk, soy milk, and yogurt   Fruit and fruit juice   Sweets (cakes, cookies, ice cream, jam and jelly)  Your RDN will help you set a goal for how many carbohydrate servings to eat at your meals and snacks. For many adults, eating 3 to 5 servings of carbohydrate foods at each meal and 1 or 2 carbohydrate servings  for each snack works well.   Check your blood glucose level regularly. It can tell you if you need to adjust when you eat carbohydrates.  Choose foods rich in viscous (soluble) fiber  Viscous, or soluble, is found  in the walls of plant cells. Viscous fiber is found only in plant-based foods. Eating foods with fiber helps to lower your unhealthy cholesterol and keep your blood glucose in range   Rich sources of viscous fiber include vegetables (asparagus, Brussels sprouts, sweet potatoes, turnips) fruit (apricots, mangoes, oranges), legumes, and whole grains (barley, oats, and oat bran).   As you increase your fiber intake gradually, also increase the amount of water you drink. This will help prevent constipation.   If you have difficulty achieving this goal, ask your RDN about fiber laxatives. Choose fiber supplements made with viscous fibers such as psyllium seed husks or methylcellulose to help lower unhealthy cholesterol.   Limit refined carbohydrates   There are three types of carbohydrates: starches, sugar, and fiber. Some carbohydrates occur naturally in food, like the starches in rice or corn or the sugars in fruits and milk. Refined carbohydrates--foods with high amounts of simple sugars--can raise triglyceride levels. High triglyceride levels are associated with coronary heart disease.  Some examples of refined carbohydrate foods are table sugar, sweets, and beverages sweetened with added sugar. Tips for Reducing Sodium (Salt) Although sodium is important for your body to function, too much sodium can be harmful for people with high blood pressure. As sodium and fluid buildup in your tissues and bloodstream, your blood pressure increases. High blood pressure may cause damage to other organs and increase your risk for a stroke. Even if you take a pill for blood pressure or a water pill (diuretic) to remove fluid, it is still important to have less salt in your diet. Ask your doctor and RDN what amount of sodium is right for you.  Avoid processed foods. Eat more fresh foods.   Fresh fruits and vegetables are naturally low in sodium, as well as frozen vegetables and fruits that have no added juices or  sauces.   Fresh meats are lower in sodium than processed meats, such as bacon, sausage, and hotdogs. Read the nutrition label or ask your butcher to help you find a fresh meat that is low in sodium.  Eat less salt--at the table and when cooking.   A single teaspoon of table salt has 2,300 mg of sodium.   Leave the salt out of recipes for pasta, casseroles, and soups.   Ask your RDN how to cook your favorite recipes without sodium  Be a smart shopper.   Look for food packages that say salt-free or sodium-free. These items contain less than 5 milligrams of sodium per serving.   Very low-sodium products contain less than 35 milligrams of sodium per serving.   Low-sodium products contain less than 140 milligrams of sodium per serving.   Beware for Unsalted or No Added Salt products. These items may still be high in sodium. Check the nutrition label.  Add flavors to your food without adding sodium.   Try lemon juice, lime juice, fruit juice or vinegar.   Dry or fresh herbs add flavor. Try basil, bay leaf, dill, rosemary, parsley, sage, dry mustard, nutmeg, thyme, and paprika.   Pepper, red pepper flakes, and cayenne pepper can add spice t your meals without adding sodium. Hot sauce contains sodium, but if you use just a drop or two, it will not add up  to much.   Buy a sodium-free seasoning blend or make your own at home. Additional Lifestyle Tips Achieve and maintain a healthy weight.  Talk with your RDN or your doctor about what is a healthy weight for you.  Set goals to reach and maintain that weight.   To lose weight, reduce your calorie intake along with increasing your physical activity. A weight loss of 10 to 15 pounds could reduce LDL-cholesterol by 5 milligrams per deciliter. Participate in physical activity.  Talk with your health care team to find out what types of physical activity are best for you. Set a plan to get about 30 minutes of exercise on most  days.  Foods Recommended Food Group Foods Recommended  Grains Whole grain breads and cereals, including whole wheat, barley, rye, buckwheat, corn, teff, quinoa, millet, amaranth, brown or wild rice, sorghum, and oats Pasta, especially whole wheat or other whole grain types  [employer] [place] [employer], quinoa or wild rice Whole grain crackers, bread, rolls, pitas Home-made bread with reduced-sodium baking soda  Protein Foods Lean cuts of beef and pork (loin, leg, round, extra lean hamburger)  Skinless Press photographer and other wild game Dried beans and peas Nuts and nut butters Meat alternatives made with soy or textured vegetable protein  Egg whites or egg substitute Cold cuts made with lean meat or soy protein  Dairy Nonfat (skim), low-fat, or 1%-fat milk  Nonfat or low-fat yogurt or cottage cheese Fat-free and low-fat cheese  Vegetables Fresh, frozen, or canned vegetables without added fat or salt   Fruits Fresh, frozen, canned, or dried fruit   Oils Unsaturated oils (corn, olive, peanut, soy, sunflower, canola)  Soft or liquid margarines and vegetable oil spreads  Salad dressings Seeds and nuts  Avocado   Foods Not Recommended Food Group Foods Not Recommended  Grains Breads or crackers topped with salt Cereals (hot or cold) with more than 300 mg sodium per serving Biscuits, cornbread, and other quick breads prepared with baking soda Bread crumbs or stuffing mix from a store High-fat bakery products, such as doughnuts, biscuits, croissants, danish pastries, pies, cookies Instant cooking foods to which you add hot water and stir--potatoes, noodles, rice, etc. Packaged starchy foods--seasoned noodle or rice dishes, stuffing mix, macaroni and cheese dinner Snacks made with partially hydrogenated oils, including chips, cheese puffs, snack mixes, regular crackers, butter-flavored popcorn  Protein Foods Higher-fat cuts of meats (ribs, t-bone steak, regular hamburger) Bacon, sausage, or hot  dogs Cold cuts, such as salami or bologna, deli meats, cured meats, corned beef Organ meats (liver, brains, gizzards, sweetbreads) Poultry with skin Fried or smoked meat, poultry, and fish Whole eggs and egg yolks (more than 2-4 per week) Salted legumes, nuts, seeds, or nut/seed butters Meat alternatives with high levels of sodium (>300 mg per serving) or saturated fat (>5 g per serving)  Dairy Whole milk,?2% fat milk, buttermilk Whole milk yogurt or ice cream Cream Half-&-half Cream cheese Sour cream Cheese  Vegetables Canned or frozen vegetables with salt, fresh vegetables prepared with salt, butter, cheese, or cream sauce Fried vegetables Pickled vegetables such as olives, pickles, or sauerkraut  Fruits Fried fruits Fruits served with butter or cream  Oils Butter, stick margarine, shortening Partially hydrogenated oils or trans fats Tropical oils (coconut, palm, palm kernel oils)  Other Candy, sugar sweetened soft drinks and desserts Salt, sea salt, garlic salt, and seasoning mixes containing salt Bouillon cubes Ketchup, barbecue sauce, Worcestershire sauce, soy sauce, teriyaki sauce Miso Salsa Pickles, olives, relish  Heart Healthy Consistent Carbohydrate Vegetarian (Lacto-Ovo) Sample 1-Day Menu  Breakfast 1 cup oatmeal, cooked (2 carbohydrate servings)   cup blueberries (1 carbohydrate serving)  11 almonds, without salt  1 cup 1% milk (1 carbohydrate serving)  1 cup coffee  Morning Snack 1 cup fat-free plain yogurt (1 carbohydrate serving)  Lunch 1 whole wheat bun (1 carbohydrate servings)  1 black bean burger (1 carbohydrate servings)  1 slice cheddar cheese, low sodium  2 slices tomatoes  2 leaves lettuce  1 teaspoon mustard  1 small pear (1 carbohydrate servings)  1 cup green tea, unsweetened  Afternoon Snack 1/3 cup trail mix with nuts, seeds, and raisins, without salt (1 carbohydrate servinga)  Evening Meal  cup meatless chicken  2/3 cup brown rice,  cooked (2 carbohydrate servings)  1 cup broccoli, cooked (2/3 carbohydrate serving)   cup carrots, cooked (1/3 carbohydrate serving)  2 teaspoons olive oil  1 teaspoon balsamic vinegar  1 whole wheat dinner roll (1 carbohydrate serving)  1 teaspoon margarine, soft, tub  1 cup 1% milk (1 carbohydrate serving)  Evening Snack 1 extra small banana (1 carbohydrate serving)  1 tablespoon peanut butter   Heart Healthy Consistent Carbohydrate Vegan Sample 1-Day Menu  Breakfast 1 cup oatmeal, cooked (2 carbohydrate servings)   cup blueberries (1 carbohydrate serving)  11 almonds, without salt  1 cup soymilk fortified with calcium, vitamin B12, and vitamin D  1 cup coffee  Morning Snack 6 ounces soy yogurt (1 carbohydrate servings)  Lunch 1 whole wheat bun(1 carbohydrate servings)  1 black bean burger (1 carbohydrate serving)  2 slices tomatoes  2 leaves lettuce  1 teaspoon mustard  1 small pear (1 carbohydrate servings)  1 cup green tea, unsweetened  Afternoon Snack 1/3 cup trail mix with nuts, seeds, and raisins, without salt (1 carbohydrate servings)  Evening Meal  cup meatless chicken  2/3 cup brown rice, cooked (2 carbohydrate servings)  1 cup broccoli, cooked (2/3 carbohydrate serving)   cup carrots, cooked (1/3 carbohydrate serving)  2 teaspoons olive oil  1 teaspoon balsamic vinegar  1 whole wheat dinner roll (1 carbohydrate serving)  1 teaspoon margarine, soft, tub  1 cup soymilk fortified with calcium, vitamin B12, and vitamin D  Evening Snack 1 extra small banana (1 carbohydrate serving)  1 tablespoon peanut butter    Heart Healthy Consistent Carbohydrate Sample 1-Day Menu  Breakfast 1 cup cooked oatmeal (2 carbohydrate servings)  3/4 cup blueberries (1 carbohydrate serving)  1 ounce almonds  1 cup skim milk (1 carbohydrate serving)  1 cup coffee  Morning Snack 1 cup sugar-free nonfat yogurt (1 carbohydrate serving)  Lunch 2 slices whole-wheat bread (2  carbohydrate servings)  2 ounces lean [place] breast  1 ounce low-fat Swiss cheese  1 teaspoon mustard  1 slice tomato  1 lettuce leaf  1 small pear (1 carbohydrate serving)  1 cup skim milk (1 carbohydrate serving)  Afternoon Snack 1 ounce trail mix with unsalted nuts, seeds, and raisins (1 carbohydrate serving)  Evening Meal 3 ounces salmon  2/3 cup cooked brown rice (2 carbohydrate servings)  1 teaspoon soft margarine  1 cup cooked broccoli with 1/2 cup cooked carrots (1 carbohydrate serving  Carrots, cooked, boiled, drained, without salt  1 cup lettuce  1 teaspoon olive oil with vinegar for dressing  1 small whole grain roll (1 carbohydrate serving)  1 teaspoon soft margarine  1 cup unsweetened tea  Evening Snack 1 extra-small banana (1 carbohydrate serving)  Copyright  2020  [employer]. All rights reserved.

## 2020-05-03 NOTE — TOC Benefit Eligibility Note (Signed)
Patient Product/process development scientist completed.    The patient is insured through [facility] and for Brilinta 90 mg has a copay of $ 24.99. Jardiance 10 mg and Farxiga 10 mg requires a Prior Authorization. Prior Authorizations I will be working on them.  Roland Earl, CPhT Pharmacy Patient Advocate Specialist [place] [facility] Stewardship Team Direct Number: [phone]  Fax: [phone]

## 2020-05-04 ENCOUNTER — Telehealth (HOSPITAL_COMMUNITY): Payer: Self-pay

## 2020-05-04 ENCOUNTER — Other Ambulatory Visit: Payer: Self-pay | Admitting: Physician Assistant

## 2020-05-04 DIAGNOSIS — I10 Essential (primary) hypertension: Secondary | ICD-10-CM

## 2020-05-04 LAB — GLUCOSE, CAPILLARY: Glucose-Capillary: 141 mg/dL — ABNORMAL HIGH (ref 70–99)

## 2020-05-04 MED ORDER — DAPAGLIFLOZIN PROPANEDIOL 10 MG PO TABS: 10.0000 mg | ORAL_TABLET | Freq: Every day | ORAL | 6 refills | Status: DC

## 2020-05-04 MED ORDER — TICAGRELOR 90 MG PO TABS: 90.0000 mg | ORAL_TABLET | Freq: Two times a day (BID) | ORAL | 11 refills | Status: DC

## 2020-05-04 MED ORDER — NITROGLYCERIN 0.4 MG SL SUBL
0.4000 mg | SUBLINGUAL_TABLET | SUBLINGUAL | 12 refills | Status: DC | PRN
Start: 2020-05-04 — End: 2021-08-28

## 2020-05-04 MED ORDER — METOPROLOL SUCCINATE ER 25 MG PO TB24: 12.5000 mg | ORAL_TABLET | Freq: Every day | ORAL | 3 refills | Status: DC

## 2020-05-04 MED ORDER — ASPIRIN EC 81 MG PO TBEC: 81.0000 mg | DELAYED_RELEASE_TABLET | Freq: Every day | ORAL | 3 refills | Status: DC

## 2020-05-04 MED FILL — BRILINTA 90 MG TABLET: 90 | 30 days supply | Qty: 60 | Fill #0

## 2020-05-04 MED FILL — FARXIGA 10 MG TABLET: 10 | 30 days supply | Qty: 30 | Fill #0

## 2020-05-04 MED FILL — METOPROLOL SUCCINATE ER 25: 25 | 30 days supply | Qty: 15 | Fill #0

## 2020-05-04 MED FILL — ASPIRIN LOW DOSE 81 MG TBEC: 81 | 90 days supply | Qty: 90 | Fill #0

## 2020-05-04 MED FILL — NITROGLYCERIN 0.4 MG TAB SL: 0.4 | 8 days supply | Qty: 25 | Fill #0

## 2020-05-04 NOTE — Progress Notes (Signed)
CARDIAC REHAB PHASE I   PRE:  Rate/Rhythm: 87 SR  BP:  Supine:   Sitting: 114/72  Standing:    SaO2: 96 RA  MODE:  Ambulation: 470 ft   POST:  Rate/Rhythm: 94 SR  BP:  Supine:   Sitting: 139/82  Standing:    SaO2: 98 RA  Pt tolerated exercise well with standby assist and no assistive device. Able to amb 470 ft with no rest breaks and mild fatigue and SOB. Pt had a few additional questions on heart healthy diet that were answered. Pt resting in bed with wife in the room and call bell in reach.  [id number] Harrie Jeans ACSM-EP 05/04/2020 10:12 AM

## 2020-05-04 NOTE — Telephone Encounter (Signed)
Per phase I, fax cardiac rehab referral to [place] [facility]. 

## 2020-05-04 NOTE — Discharge Summary (Addendum)
The patient has been seen in conjunction with Vin Bhagat, PAC. All aspects of care have been considered and discussed. The patient has been personally interviewed, examined, and all clinical data has been reviewed.   He has been well educated concerning risk reduction.  Needs phase 2 cardiac rehab.  He needs advanced lipid clinic consultation to see if there are any other options for elevated triglyceride.  Follow-up with Dr. Eldridge Dace   Discharge Summary    Patient ID: Dan Ford MRN: [id number]; DOB: [birth year]  Admit date: 05/01/2020 Discharge date: 05/04/2020  PCP:  Shellia Cleverly, PA   [facility]  Cardiologist:  Lance Muss, MD   Discharge Diagnoses    Principal Problem:   STEMI (ST elevation myocardial infarction) [facility]) Active Problems:   Essential hypertension   Type 2 diabetes mellitus with hyperglycemia, with long-term current use of insulin (HCC)   Sleep apnea, obstructive   Mixed hyperlipidemia   CAD  Diagnostic Studies/Procedures    Echo 05/02/20 1. Left ventricular ejection fraction, by estimation, is 55 to 60%. The  left ventricle has normal function. The left ventricle demonstrates  regional wall motion abnormalities with basal inferior severe hypokinesis.  There is mild left ventricular  hypertrophy. Left ventricular diastolic parameters are consistent with  Grade I diastolic dysfunction (impaired relaxation).  2. Right ventricular systolic function is normal. The right ventricular  size is normal. There is normal pulmonary artery systolic pressure. The  estimated right ventricular systolic pressure is 14.7 mmHg.  3. The mitral valve is normal in structure. Trivial mitral valve  regurgitation. No evidence of mitral stenosis.  4. The aortic valve is tricuspid. Aortic valve regurgitation is not  visualized. Mild aortic valve sclerosis is present, with no evidence of  aortic valve stenosis.  5. The inferior vena  cava is dilated in size with >50% respiratory  variability, suggesting right atrial pressure of 8 mmHg.    Coronary/Graft Acute MI Revascularization  05/01/20  LEFT HEART CATH AND CORONARY ANGIOGRAPHY    Conclusion    Dist RCA lesion is 100% stenosed. A drug-eluting stent was successfully placed using a STENT RESOLUTE ONYX 2.5X18, postdilated to 3.0 mm in the proximal stent.  Post intervention, there is a 0% residual stenosis.  Ramus lesion is 25% stenosed. Large ramus and small circumflex.  Dist LAD lesion is 25% stenosed. Distal LAD tapers diffusely.  The left ventricular systolic function is normal.  The left ventricular ejection fraction is 55-65% by visual estimate. Basal inferior hypokinesis.  LV end diastolic pressure is normal. LVEDP 11 mm Hg.  There is no aortic valve stenosis.  A drug-eluting stent was successfully placed using a STENT RESOLUTE ONYX 2.5X18.   Continue aggressive secondary prevention with risk factor modification.  Finish current bag of aggrastat and then stop.  Watch in ICU.   Dual antiplatelet therapy for 12 months.  High dose statin.  Will refer to our pharmD lipid clinic to see what  Other therapies or trials may be helpful for TG lowering.    Diagnostic Dominance: Right    Intervention     History of Present Illness     Dan Ford is a 50 y.o. male with history of T2DM on insulin, hypertriglceridemia, pancreatitis due to this, anxiety disorder with acute chest pain who found to have inferior STEMI.  Developed acute chest pain around 9PM on 05/01/20  He had intermittent chest pains for the last three-four days that was stuttering with exertion.  He had some right-sided arm discomfort as well.  He developed acute chest pressure and pain that provoked him to call EMS.  He was found to have inferior STE with lateral depressions and STEMI was activated.  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  He has seen an endocrinologist and its suspected this is genetically mediated as diabetes is fairly well controlled.  Hospital Course     Consultants: None  1. Coronary artery disease with inferior ST elevation MI:  Cath showed 100% stenosed distal RCA s/p drug-eluting stent was successfully placed using a STENT RESOLUTE ONYX 2.5X18. Plan to continue aspirin and Brilinta x12 months.  It would be best to continue using Brilinta in this patient since he is diabetic and there is improved outcomes on Brilinta in this subgroup.  Echo with preserved LVEF. Ambulated well with cardiac rehab. Continue BB and ACE.   2. Severe mixed hyperlipidemia: 05/02/2020: Cholesterol 392; HDL 19; LDL Cholesterol UNABLE TO CALCULATE IF TRIGLYCERIDE OVER 400 mg/dL; Triglycerides 2,693; VLDL UNABLE TO CALCULATE IF TRIGLYCERIDE OVER 400 mg/dL  On Crestor, Fenofibrate and Vascepa. Advanced lipid clinic referral as outpatient. Previously seen at lipid clinic in [place] with genetic testing.    3. Hypertension: Relatively stable. Started Toprol-XL to 12.5 mg/day.  Continue home  Lisinopril.   4. Obstructive sleep apnea: On  CPAP.  5.   Uncontrolled Diabetes mellitus type II:.  HgbA1c 12. Started Farxiga 10 mg/day.               Continue home Levemir. Endocrinologist and diabetic education referral.  The patient been seen by Dr. Katrinka Blazing today and deemed ready for discharge home. All follow-up appointments have been scheduled. Discharge medications are listed below.   Did the patient have an acute coronary syndrome (MI, NSTEMI, STEMI, etc) this admission?:  Yes                               AHA/ACC Clinical Performance & Quality Measures: 5. Aspirin prescribed? - Yes 6. ADP Receptor Inhibitor (Plavix/Clopidogrel, Brilinta/Ticagrelor or Effient/Prasugrel) prescribed (includes medically managed patients)? - Yes 7. Beta Blocker prescribed? - Yes 8. High Intensity Statin (Lipitor 40-80mg  or Crestor  20-40mg ) prescribed? - Yes 9. EF assessed during THIS hospitalization? - Yes 10. For EF <40%, was ACEI/ARB prescribed? - Yes 11. For EF <40%, Aldosterone Antagonist (Spironolactone or Eplerenone) prescribed? - Not Applicable (EF >/= 40%) 12. Cardiac Rehab Phase II ordered (including medically managed patients)? - Yes   Discharge Vitals Blood pressure 114/72, pulse 85, temperature 98.2 F (36.8 C), temperature source Oral, resp. rate 17, SpO2 97 %.  There were no vitals filed for this visit.  Labs & Radiologic Studies    CBC Recent Labs    05/01/20 2337 05/02/20 0039  WBC  --  8.5  NEUTROABS  --  6.7  HGB 11.2* 11.6*  HCT 33.0* 32.9*  MCV  --  87.5  PLT  --  239   Basic Metabolic Panel Recent Labs    84/16/60 0039 05/03/20 1128  NA 130* 133*  K 3.7 3.9  CL 98 101  CO2 17* 24  GLUCOSE 308* 229*  BUN 24* 11  CREATININE 0.71 0.68  CALCIUM 8.5* 8.4*   Liver Function Tests Recent Labs    05/02/20 0039  AST 47*  ALT 22  ALKPHOS 84  BILITOT 1.0  PROT 5.5*  ALBUMIN 3.0*   High  Sensitivity Troponin:   Recent Labs  Lab 05/02/20 0039 05/02/20 0619  TROPONINIHS 4,155* 21,317*    Hemoglobin A1C Recent Labs    05/02/20 0039  HGBA1C 12.0*   Fasting Lipid Panel Recent Labs    05/02/20 0039  CHOL 392*  HDL 19*  LDLCALC UNABLE TO CALCULATE IF TRIGLYCERIDE OVER 400 mg/dL  TRIG 1,610*  CHOLHDL NOT REPORTED DUE TO HIGH TRIGLYCERIDES  LDLDIRECT <10   _____________  CARDIAC CATHETERIZATION  Addendum Date: 05/02/2020    Dist RCA lesion is 100% stenosed. A drug-eluting stent was successfully placed using a STENT RESOLUTE ONYX 2.5X18, postdilated to 3.0 mm in the proximal stent.  Post intervention, there is a 0% residual stenosis.  Ramus lesion is 25% stenosed. Large ramus and small circumflex.  Dist LAD lesion is 25% stenosed. Distal LAD tapers diffusely.  The left ventricular systolic function is normal.  The left ventricular ejection fraction is 55-65% by  visual estimate. Basal inferior hypokinesis.  LV end diastolic pressure is normal. LVEDP 11 mm Hg.  There is no aortic valve stenosis.  A drug-eluting stent was successfully placed using a STENT RESOLUTE ONYX 2.5X18.  Continue aggressive secondary prevention with risk factor modification.  Finish current bag of aggrastat and then stop.  Watch in ICU. Dual antiplatelet therapy for 12 months.  High dose statin.  Will refer to our pharmD lipid clinic to see what  Other therapies or trials may be helpful for TG lowering.   Result Date: 05/02/2020  Dist RCA lesion is 100% stenosed. A drug-eluting stent was successfully placed using a STENT RESOLUTE ONYX 2.5X18, postdilated to 3.0 mm in the proximal stent.  Post intervention, there is a 0% residual stenosis.  Ramus lesion is 25% stenosed. Large ramus and small circumflex.  Dist LAD lesion is 25% stenosed. Distal LAD tapers diffusely.  The left ventricular systolic function is normal.  The left ventricular ejection fraction is 55-65% by visual estimate. Basal inferior hypokinesis.  LV end diastolic pressure is normal. LVEDP 11 mm Hg.  There is no aortic valve stenosis.  A drug-eluting stent was successfully placed using a STENT RESOLUTE ONYX 2.5X18.  Continue aggressive secondary prevention with risk factor modification.  Dual antiplatelet therapy for 12 months.  High dose statin.  Will refer to our pharmD lipid clinic to see what  Other therapies or trials may be helpful for TG lowering.   Portable chest x-ray 1 view  Result Date: 05/02/2020 CLINICAL DATA:  Sore chest post heart catheterization and stenting last night, STEMI EXAM: PORTABLE CHEST 1 VIEW COMPARISON:  Portable exam 0540 hours without priors for comparison FINDINGS: Normal heart size, mediastinal contours, and pulmonary vascularity. Mild bibasilar atelectasis. Upper lungs clear. No pleural effusion or pneumothorax. Osseous structures unremarkable. IMPRESSION: Bibasilar atelectasis.  Electronically Signed   By: Ulyses Southward M.D.   On: 05/02/2020 08:11   ECHOCARDIOGRAM COMPLETE  Result Date: 05/02/2020    ECHOCARDIOGRAM REPORT   Patient Name:   Dan Ford Date of Exam: 05/02/2020 Medical Rec #:  [id number]         Height: Accession #:    [id number]        Weight: Date of Birth:  [birth year]         BSA: Patient Age:    50 years          BP:           140/72 mmHg Patient Gender: M  HR:           57 bpm. Exam Location:  Inpatient Procedure: 2D Echo, 3D Echo, Cardiac Doppler and Color Doppler Indications:    121-121.4 ST elevation (STEMI) and non-ST elevation (NSTEMI)                 myocardial infarction  History:        Patient has no prior history of Echocardiogram examinations.                 Acute MI and CAD; Risk Factors:Hypertension, Diabetes and                 Dyslipidemia.  Sonographer:    Sheralyn Boatman RDCS Referring Phys: [id number] CARRIEL T NIPP  Sonographer Comments: Image acquisition challenging due to patient body habitus. IMPRESSIONS  1. Left ventricular ejection fraction, by estimation, is 55 to 60%. The left ventricle has normal function. The left ventricle demonstrates regional wall motion abnormalities with basal inferior severe hypokinesis. There is mild left ventricular hypertrophy. Left ventricular diastolic parameters are consistent with Grade I diastolic dysfunction (impaired relaxation).  2. Right ventricular systolic function is normal. The right ventricular size is normal. There is normal pulmonary artery systolic pressure. The estimated right ventricular systolic pressure is 14.7 mmHg.  3. The mitral valve is normal in structure. Trivial mitral valve regurgitation. No evidence of mitral stenosis.  4. The aortic valve is tricuspid. Aortic valve regurgitation is not visualized. Mild aortic valve sclerosis is present, with no evidence of aortic valve stenosis.  5. The inferior vena cava is dilated in size with >50% respiratory variability, suggesting right  atrial pressure of 8 mmHg. FINDINGS  Left Ventricle: Left ventricular ejection fraction, by estimation, is 55 to 60%. The left ventricle has normal function. The left ventricle demonstrates regional wall motion abnormalities. The left ventricular internal cavity size was normal in size. There is mild left ventricular hypertrophy. Left ventricular diastolic parameters are consistent with Grade I diastolic dysfunction (impaired relaxation). Right Ventricle: The right ventricular size is normal. No increase in right ventricular wall thickness. Right ventricular systolic function is normal. There is normal pulmonary artery systolic pressure. The tricuspid regurgitant velocity is 1.71 m/s, and  with an assumed right atrial pressure of 3 mmHg, the estimated right ventricular systolic pressure is 14.7 mmHg. Left Atrium: Left atrial size was normal in size. Right Atrium: Right atrial size was normal in size. Pericardium: There is no evidence of pericardial effusion. Mitral Valve: The mitral valve is normal in structure. Trivial mitral valve regurgitation. No evidence of mitral valve stenosis. Tricuspid Valve: The tricuspid valve is normal in structure. Tricuspid valve regurgitation is trivial. Aortic Valve: The aortic valve is tricuspid. Aortic valve regurgitation is not visualized. Mild aortic valve sclerosis is present, with no evidence of aortic valve stenosis. Pulmonic Valve: The pulmonic valve was normal in structure. Pulmonic valve regurgitation is trivial. Aorta: The aortic root is normal in size and structure. Venous: The inferior vena cava is dilated in size with greater than 50% respiratory variability, suggesting right atrial pressure of 8 mmHg. IAS/Shunts: No atrial level shunt detected by color flow Doppler.  LEFT VENTRICLE PLAX 2D LVIDd:         4.40 cm      Diastology LVIDs:         3.00 cm      LV e' medial:    6.20 cm/s LV PW:         1.70 cm  LV E/e' medial:  15.1 LV IVS:        1.30 cm      LV e'  lateral:   7.62 cm/s LVOT diam:     2.30 cm      LV E/e' lateral: 12.3 LV SV:         72 LVOT Area:     4.15 cm  LV Volumes (MOD) LV vol d, MOD A2C: 65.1 ml LV vol d, MOD A4C: 106.0 ml LV vol s, MOD A2C: 27.0 ml LV vol s, MOD A4C: 45.8 ml LV SV MOD A2C:     38.1 ml LV SV MOD A4C:     106.0 ml LV SV MOD BP:      49.8 ml RIGHT VENTRICLE             IVC RV S prime:     14.00 cm/s  IVC diam: 2.10 cm TAPSE (M-mode): 2.1 cm LEFT ATRIUM             RIGHT ATRIUM LA diam:        3.80 cm RA Area:     12.50 cm LA Vol (A2C):   39.8 ml RA Volume:   25.70 ml LA Vol (A4C):   34.5 ml LA Biplane Vol: 38.4 ml  AORTIC VALVE LVOT Vmax:   106.00 cm/s LVOT Vmean:  67.700 cm/s LVOT VTI:    0.173 m  AORTA Ao Root diam: 3.30 cm Ao Asc diam:  3.20 cm MITRAL VALVE               TRICUSPID VALVE MV Area (PHT): 3.99 cm    TR Peak grad:   11.7 mmHg MV Decel Time: 190 msec    TR Vmax:        171.00 cm/s MV E velocity: 93.80 cm/s MV A velocity: 96.00 cm/s  SHUNTS MV E/A ratio:  0.98        Systemic VTI:  0.17 m                            Systemic Diam: 2.30 cm Marca Ancona MD Electronically signed by Marca Ancona MD Signature Date/Time: 05/02/2020/3:42:31 PM    Final    Disposition   Pt is being discharged home today in good condition.  Follow-up Plans & Appointments     Follow-up Information    Corky Crafts, MD. Go on 05/14/2020.   Specialties: Cardiology, Radiology, Interventional Cardiology Why: :40am for hospital follow up  Contact information: [address] [place] [address] [phone]              Discharge Instructions    AMB Referral to Advanced Lipid Disorders Clinic   Complete by: As directed    Internal Lipid Clinic Referral Scheduling  Internal lipid clinic referrals are providers within [facility], who wish to refer established patients for routine management (help in starting PCSK9 inhibitor therapy) or advanced therapies.  Internal MD referral criteria:              1.  All patients with LDL>190 mg/dL  2. All patients with Triglycerides >500 mg/dL  3. Patients with suspected or confirmed heterozygous familial hyperlipidemia (HeFH) or homozygous familial hyperlipidemia (HoFH)  4. Patients with family history of suspicious for genetic dyslipidemia desiring genetic testing  5. Patients refractory to standard guideline based therapy  6. Patients with statin intolerance (failed 2 statins, one of which must be a high  potency statin)  7. Patients who the provider desires to be seen by MD   Internal PharmD referral criteria:   1. Follow-up patients for medication management  2. Follow-up for compliance monitoring  3. Patients for drug education  4. Patients with statin intolerance  5. PCSK9 inhibitor education and prior authorization approvals  6. Patients with triglycerides <500 mg/dL  External Lipid Clinic Referral  External lipid clinic referrals are for providers outside of [facility], considered new clinic patients - automatically routed to MD schedule   AMB Referral to Cardiac Rehabilitation - Phase II   Complete by: As directed    Diagnosis:  Coronary Stents STEMI     After initial evaluation and assessments completed: Virtual Based Care may be provided alone or in conjunction with Phase 2 Cardiac Rehab based on patient barriers.: Yes   Amb Referral to Cardiac Rehabilitation   Complete by: As directed    [place] Phase 2 Cardiac Rehab   Diagnosis:  STEMI Coronary Stents     After initial evaluation and assessments completed: Virtual Based Care may be provided alone or in conjunction with Phase 2 Cardiac Rehab based on patient barriers.: Yes   Ambulatory referral to Endocrinology   Complete by: As directed    Ambulatory referral to Nutrition and Diabetic Education   Complete by: As directed    Diet - low sodium heart healthy   Complete by: As directed    Discharge instructions   Complete by: As directed    No driving for 2 weeks. No  lifting over 10 lbs for 4 weeks. No sexual activity for 4 weeks. You may not return to work until cleared by your cardiologist. Keep procedure site clean & dry. If you notice increased pain, swelling, bleeding or pus, call/return!  You may shower, but no soaking baths/hot tubs/pools for 1 week.   Increase activity slowly   Complete by: As directed       Discharge Medications   Allergies as of 05/04/2020      Reactions   Amoxicillin Nausea Only      Medication List    TAKE these medications   aspirin EC 81 MG tablet Take 1 tablet (81 mg total) by mouth daily. Swallow whole.   dapagliflozin propanediol 10 MG Tabs tablet Commonly known as: FARXIGA Take 1 tablet (10 mg total) by mouth daily. Start taking on: May 05, 2020   escitalopram 20 MG tablet Commonly known as: LEXAPRO Take 20 mg by mouth daily.   fenofibrate 160 MG tablet Take 160 mg by mouth daily.   insulin detemir 100 UNIT/ML injection Commonly known as: LEVEMIR Inject 50 Units into the skin daily.   lisinopril 10 MG tablet Commonly known as: ZESTRIL Take 10 mg by mouth daily.   metoprolol succinate 25 MG 24 hr tablet Commonly known as: Toprol XL Take 0.5 tablets (12.5 mg total) by mouth daily.   nitroGLYCERIN 0.4 MG SL tablet Commonly known as: NITROSTAT Place 1 tablet (0.4 mg total) under the tongue every 5 (five) minutes x 3 doses as needed for chest pain.   Omega-3 1000 MG Caps Take 1,000 mg by mouth daily.   pantoprazole 40 MG tablet Commonly known as: PROTONIX Take 40 mg by mouth daily.   rosuvastatin 40 MG tablet Commonly known as: CRESTOR Take 40 mg by mouth daily.   ticagrelor 90 MG Tabs tablet Commonly known as: BRILINTA Take 1 tablet (90 mg total) by mouth 2 (two) times daily.   Vascepa 1  g capsule Generic drug: icosapent Ethyl Take 2 g by mouth 2 (two) times daily.         Outstanding Labs/Studies    Lipid clinic and endocrinologist referral   Duration of Discharge Encounter    Greater than 30 minutes including physician time.  Lorelei PontSigned, Bhavinkumar Bhagat, PA 05/04/2020, 10:44 AM

## 2020-05-04 NOTE — Plan of Care (Signed)
  Problem: Education: Goal: Knowledge of General Education information will improve Description: Including pain rating scale, medication(s)/side effects and non-pharmacologic comfort measures Outcome: Adequate for Discharge   Problem: Health Behavior/Discharge Planning: Goal: Ability to manage health-related needs will improve Outcome: Adequate for Discharge   Problem: Clinical Measurements: Goal: Ability to maintain clinical measurements within normal limits will improve Outcome: Adequate for Discharge Goal: Will remain free from infection Outcome: Adequate for Discharge Goal: Diagnostic test results will improve Outcome: Adequate for Discharge Goal: Respiratory complications will improve Outcome: Adequate for Discharge Goal: Cardiovascular complication will be avoided Outcome: Adequate for Discharge   Problem: Activity: Goal: Risk for activity intolerance will decrease Outcome: Adequate for Discharge   Problem: Nutrition: Goal: Adequate nutrition will be maintained Outcome: Adequate for Discharge   Problem: Coping: Goal: Level of anxiety will decrease Outcome: Adequate for Discharge   Problem: Elimination: Goal: Will not experience complications related to bowel motility Outcome: Adequate for Discharge Goal: Will not experience complications related to urinary retention Outcome: Adequate for Discharge   Problem: Pain Managment: Goal: General experience of comfort will improve Outcome: Adequate for Discharge   Problem: Safety: Goal: Ability to remain free from injury will improve Outcome: Adequate for Discharge   Problem: Skin Integrity: Goal: Risk for impaired skin integrity will decrease Outcome: Adequate for Discharge   Problem: Education: Goal: Understanding of cardiac disease, CV risk reduction, and recovery process will improve Outcome: Adequate for Discharge Goal: Understanding of medication regimen will improve Outcome: Adequate for Discharge Goal:  Individualized Educational Video(s) Outcome: Adequate for Discharge   Problem: Activity: Goal: Ability to tolerate increased activity will improve Outcome: Adequate for Discharge   Problem: Cardiac: Goal: Ability to achieve and maintain adequate cardiopulmonary perfusion will improve Outcome: Adequate for Discharge Goal: Vascular access site(s) Level 0-1 will be maintained Outcome: Adequate for Discharge   Problem: Health Behavior/Discharge Planning: Goal: Ability to safely manage health-related needs after discharge will improve Outcome: Adequate for Discharge   

## 2020-05-04 NOTE — Plan of Care (Signed)

## 2020-05-13 NOTE — Progress Notes (Unsigned)
Cardiology Office Note   Date:  05/14/2020   ID:  Dan Ford, DOB 07-24-1970, MRN [id number]  PCP:  Dan Cleverly, Dan Ford    No chief complaint on file.  CAD  Wt Readings from Last 3 Encounters:  05/14/20 219 lb (99.3 kg)       History of Present Illness: Dan Ford is a 50 y.o. male  with history of HTN, T2DM on insulin, severe hypertriglyceridemia, nonobstructive CAD, obesity, anxiety disorder who had acute inferior STEMI treated with PCI in 05/01/20.  Cath showed:  "Dist RCA lesion is 100% stenosed. A drug-eluting stent was successfully placed using a STENT RESOLUTE ONYX 2.5X18, postdilated to 3.0 mm in the proximal stent.  Post intervention, there is a 0% residual stenosis.  Ramus lesion is 25% stenosed. Large ramus and small circumflex.  Dist LAD lesion is 25% stenosed. Distal LAD tapers diffusely.  The left ventricular systolic function is normal.  The left ventricular ejection fraction is 55-65% by visual estimate. Basal inferior hypokinesis.  LV end diastolic pressure is normal. LVEDP 11 mm Hg.  There is no aortic valve stenosis.  A drug-eluting stent was successfully placed using a STENT RESOLUTE ONYX 2.5X18.  Continue aggressive secondary prevention with risk factor modification.  Dual antiplatelet therapy for 12 months. High dose statin. Will refer to our pharmD lipid clinic to see what Other therapies or trials may be helpful for TG lowering. "  Denies : exertional Chest pain. Dizziness. Leg edema. Nitroglycerin use. Orthopnea. Palpitations. Paroxysmal nocturnal dyspnea. Shortness of breath. Syncope.   No bleeding problems.    Past Medical History:  Diagnosis Date  . Coronary artery disease   . Diabetes mellitus without complication (HCC)   . Family history of adverse reaction to anesthesia   . Hyperlipidemia   . Hypertension     Past Surgical History:  Procedure Laterality Date  . APPENDECTOMY    . ARTHOSCOPIC ROTAOR CUFF  REPAIR    . CHOLECYSTECTOMY    . CORONARY/GRAFT ACUTE MI REVASCULARIZATION N/A 05/01/2020   Procedure: Coronary/Graft Acute MI Revascularization;  Surgeon: Corky Crafts, MD;  Location[facility] INVASIVE CV LAB;  Service: Cardiovascular;  Laterality: N/A;  . LEFT HEART CATH AND CORONARY ANGIOGRAPHY N/A 05/01/2020   Procedure: LEFT HEART CATH AND CORONARY ANGIOGRAPHY;  Surgeon: Corky Crafts, MD;  Location: [facility] INVASIVE CV LAB;  Service: Cardiovascular;  Laterality: N/A;     Current Outpatient Medications  Medication Sig Dispense Refill  . aspirin EC 81 MG tablet Take 1 tablet (81 mg total) by mouth daily. Swallow whole. 90 tablet 3  . dapagliflozin propanediol (FARXIGA) 10 MG TABS tablet Take 1 tablet (10 mg total) by mouth daily. 30 tablet 6  . escitalopram (LEXAPRO) 20 MG tablet Take 20 mg by mouth daily.    . fenofibrate 160 MG tablet Take 160 mg by mouth daily.    . insulin detemir (LEVEMIR) 100 UNIT/ML injection Inject 50 Units into the skin daily.    Marland Kitchen lisinopril (ZESTRIL) 10 MG tablet Take 10 mg by mouth daily.    . metoprolol succinate (TOPROL XL) 25 MG 24 hr tablet Take 0.5 tablets (12.5 mg total) by mouth daily. 30 tablet 3  . nitroGLYCERIN (NITROSTAT) 0.4 MG SL tablet Place 1 tablet (0.4 mg total) under the tongue every 5 (five) minutes x 3 doses as needed for chest pain. 25 tablet 12  . Omega-3 1000 MG CAPS Take 1,000 mg by mouth daily.    . pantoprazole (  PROTONIX) 40 MG tablet Take 40 mg by mouth daily.    . rosuvastatin (CRESTOR) 40 MG tablet Take 40 mg by mouth daily.    . ticagrelor (BRILINTA) 90 MG TABS tablet Take 1 tablet (90 mg total) by mouth 2 (two) times daily. 60 tablet 11  . VASCEPA 1 g capsule Take 2 g by mouth 2 (two) times daily.     No current facility-administered medications for this visit.    Allergies:   Amoxicillin-pot clavulanate, Amoxicillin, and Iodine-131    Social History:  The patient  reports that he has quit smoking. He has never used  smokeless tobacco. He reports that he does not drink alcohol and does not use drugs.   Family History:  The patient's family history includes Arrhythmia in his mother; Atrial fibrillation in his mother.    ROS:  Please see the history of present illness.   Otherwise, review of systems are positive for nuisance bleeding.   All other systems are reviewed and negative.    PHYSICAL EXAM: VS:  BP 100/66   Pulse 97   Ht 5\' 10"  (1.778 m)   Wt 219 lb (99.3 kg)   SpO2 98%   BMI 31.42 kg/m  , BMI Body mass index is 31.42 kg/m. GEN: Well nourished, well developed, in no acute distress  HEENT: normal  Neck: no JVD, carotid bruits, or masses Cardiac: RRR; no murmurs, rubs, or gallops,no edema  Respiratory:  clear to auscultation bilaterally, normal work of breathing GI: soft, nontender, nondistended, + BS MS: no deformity or atrophy  Skin: warm and dry, no rash Neuro:  Strength and sensation are intact Psych: euthymic mood, full affect   EKG:   The ekg ordered today demonstrates NSR, nonspecific ST changes   Recent Labs: 05/02/2020: ALT 22; Hemoglobin 11.6; Platelets 239 05/03/2020: B Natriuretic Peptide 52.6; BUN 11; Creatinine, Ser 0.68; Potassium 3.9; Sodium 133   Lipid Panel    Component Value Date/Time   CHOL 392 (H) 05/02/2020 0039   TRIG 2,693 (H) 05/02/2020 0039   HDL 19 (L) 05/02/2020 0039   CHOLHDL NOT REPORTED DUE TO HIGH TRIGLYCERIDES 05/02/2020 0039   VLDL UNABLE TO CALCULATE IF TRIGLYCERIDE OVER 400 mg/dL 07/02/2020 86/57/8469   LDLCALC UNABLE TO CALCULATE IF TRIGLYCERIDE OVER 400 mg/dL 6295 28/41/3244   LDLDIRECT <10 05/02/2020 0039     Other studies Reviewed: Additional studies/ records that were reviewed today with results demonstrating: hospital records reviewed, labs reviewed.   ASSESSMENT AND PLAN:  1. CAD: No angina since leaving the hospitals.  Continue DAPT for 12 months.  Return to work on 05/21/20.  He works from home.   2. DM: A1C well above target. High  fiber diet, whole food plant based diet.    He has had pancreatitis from high TG in the past.  Seeing nutritionist. 3. Hyperlipidemia: Has had high TG and total cholesterol.  Continue statin.  Refer to PharmD lipid clinic.  Refer to PharmD to start PCSK9 inhibitor before thinking about any lipid trials.    Current medicines are reviewed at length with the patient today.  The patient concerns regarding his medicines were addressed.  The following changes have been made:  No change  Labs/ tests ordered today include:  No orders of the defined types were placed in this encounter.   Recommend 150 minutes/week of aerobic exercise Low fat, low carb, high fiber diet recommended  Disposition:   FU in 3 months   Signed, 05/23/20, MD  05/14/2020  12:07 PM    [facility] [address] [place], [place], [place]  [address] Phone: [phone]; Fax: [phone]

## 2020-05-14 ENCOUNTER — Ambulatory Visit (INDEPENDENT_AMBULATORY_CARE_PROVIDER_SITE_OTHER): Payer: BC Managed Care – PPO | Admitting: Interventional Cardiology

## 2020-05-14 ENCOUNTER — Other Ambulatory Visit: Payer: Self-pay

## 2020-05-14 ENCOUNTER — Telehealth: Payer: Self-pay | Admitting: Interventional Cardiology

## 2020-05-14 ENCOUNTER — Encounter: Payer: Self-pay | Admitting: Interventional Cardiology

## 2020-05-14 ENCOUNTER — Telehealth: Payer: Self-pay | Admitting: Pharmacist

## 2020-05-14 VITALS — BP 100/66 | HR 97 | Ht 70.0 in | Wt 219.0 lb

## 2020-05-14 DIAGNOSIS — E782 Mixed hyperlipidemia: Secondary | ICD-10-CM | POA: Diagnosis not present

## 2020-05-14 DIAGNOSIS — E1159 Type 2 diabetes mellitus with other circulatory complications: Secondary | ICD-10-CM | POA: Diagnosis not present

## 2020-05-14 DIAGNOSIS — Z0279 Encounter for issue of other medical certificate: Secondary | ICD-10-CM

## 2020-05-14 DIAGNOSIS — I25118 Atherosclerotic heart disease of native coronary artery with other forms of angina pectoris: Secondary | ICD-10-CM

## 2020-05-14 DIAGNOSIS — Z794 Long term (current) use of insulin: Secondary | ICD-10-CM | POA: Diagnosis not present

## 2020-05-14 NOTE — Telephone Encounter (Signed)
-----   Message from Corky Crafts, MD sent at 05/14/2020  1:44 PM EDT ----- Please do. Thanks.  In 2018 Care everywhere, TC was > 300 as well.  Not sure if he was on statin at that time.   JV ----- Message ----- From: Awilda Metro, RPH-CPP Sent: 05/14/2020   1:41 PM EDT To: Corky Crafts, MD  His elevated TG are his primary issue, his direct LDL was actually < 10 when checked the other week. This is likely falsely low due to his TG being so high, however PCSK9i will only lower his LDL not his TG. His insurance also won't approve PCSK9i with an LDL < 70 since they'll view him as being at goal. He really needs to work to lower his A1c as this is likely the biggest contributing factor towards his elevated TG. He's already taking optimal doses of rosuvastatin, fenofibrate, and Vascepa, so the only other main med that would lower his TG further would be Niaspan. Unsure if lifestyle is also contributing (alcohol/carbs/sugar). I'd be happy to see him in lipid clinic to discuss further if he's interested.  Thanks, Aundra Millet ----- Message ----- From: Corky Crafts, MD Sent: 05/14/2020  12:43 PM EDT To: Awilda Metro, RPH-CPP  Megan,  He had an MI recently.  VEry high Cholesterol and TG.  Do you agree that PCSK9 inhibitor would be next best step.

## 2020-05-14 NOTE — Telephone Encounter (Signed)
[facility] received 2 forms [employer] for Dr. Eldridge Dace to complete. Forms given to Dr. Eldridge Dace (patient in office). 05/14/20  KLM

## 2020-05-14 NOTE — Patient Instructions (Signed)
Medication Instructions:  Your physician recommends that you continue on your current medications as directed. Please refer to the Current Medication list given to you today.  *If you need a refill on your cardiac medications before your next appointment, please call your pharmacy*   Lab Work: none If you have labs (blood work) drawn today and your tests are completely normal, you will receive your results only by: Marland Kitchen MyChart Message (if you have MyChart) OR . A paper copy in the mail If you have any lab test that is abnormal or we need to change your treatment, we will call you to review the results.   Testing/Procedures: none   Follow-Up: At [facility], you and your health needs are our priority.  As part of our continuing mission to provide you with exceptional heart care, we have created designated Provider Care Teams.  These Care Teams include your primary Cardiologist (physician) and Advanced Practice Providers (APPs -  Physician Assistants and Nurse Practitioners) who all work together to provide you with the care you need, when you need it.  We recommend signing up for the patient portal called "MyChart".  Sign up information is provided on this After Visit Summary.  MyChart is used to connect with patients for Virtual Visits (Telemedicine).  Patients are able to view lab/test results, encounter notes, upcoming appointments, etc.  Non-urgent messages can be sent to your provider as well.   To learn more about what you can do with MyChart, go to [email or web address].    Your next appointment:   June 16,2022 at 4:20  The format for your next appointment:   In Person  Provider:   Everette Rank, MD   Other Instructions  High-Fiber Eating Plan Fiber, also called dietary fiber, is a type of carbohydrate. It is found foods such as fruits, vegetables, whole grains, and beans. A high-fiber diet can have many health benefits. Your health care provider may recommend a  high-fiber diet to help:  Prevent constipation. Fiber can make your bowel movements more regular.  Lower your cholesterol.  Relieve the following conditions: ? Inflammation of veins in the anus (hemorrhoids). ? Inflammation of specific areas of the digestive tract (uncomplicated diverticulosis). ? A problem of the large intestine, also called the colon, that sometimes causes pain and diarrhea (irritable bowel syndrome, or IBS).  Prevent overeating as part of a weight-loss plan.  Prevent heart disease, type 2 diabetes, and certain cancers. What are tips for following this plan? Reading food labels  Check the nutrition facts label on food products for the amount of dietary fiber. Choose foods that have 5 grams of fiber or more per serving.  The goals for recommended daily fiber intake include: ? Men (age 81 or younger): 34-38 g. ? Men (over age 82): 28-34 g. ? Women (age 61 or younger): 25-28 g. ? Women (over age 65): 22-25 g. Your daily fiber goal is _____________ g.   Shopping  Choose whole fruits and vegetables instead of processed forms, such as apple juice or applesauce.  Choose a wide variety of high-fiber foods such as avocados, lentils, oats, and kidney beans.  Read the nutrition facts label of the foods you choose. Be aware of foods with added fiber. These foods often have high sugar and sodium amounts per serving. Cooking  Use whole-grain flour for baking and cooking.  Cook with brown rice instead of white rice. Meal planning  Start the day with a breakfast that is high in fiber, such as a  cereal that contains 5 g of fiber or more per serving.  Eat breads and cereals that are made with whole-grain flour instead of refined flour or white flour.  Eat brown rice, bulgur wheat, or millet instead of white rice.  Use beans in place of meat in soups, salads, and pasta dishes.  Be sure that half of the grains you eat each day are whole grains. General  information  You can get the recommended daily intake of dietary fiber by: ? Eating a variety of fruits, vegetables, grains, nuts, and beans. ? Taking a fiber supplement if you are not able to take in enough fiber in your diet. It is better to get fiber through food than from a supplement.  Gradually increase how much fiber you consume. If you increase your intake of dietary fiber too quickly, you may have bloating, cramping, or gas.  Drink plenty of water to help you digest fiber.  Choose high-fiber snacks, such as berries, raw vegetables, nuts, and popcorn. What foods should I eat? Fruits Berries. Pears. Apples. Oranges. Avocado. Prunes and raisins. Dried figs. Vegetables Sweet potatoes. Spinach. Kale. Artichokes. Cabbage. Broccoli. Cauliflower. Green peas. Carrots. Squash. Grains Whole-grain breads. Multigrain cereal. Oats and oatmeal. Brown rice. Barley. Bulgur wheat. [place]. Quinoa. Bran muffins. Popcorn. Rye wafer crackers. Meats and other proteins Navy beans, kidney beans, and pinto beans. Soybeans. Split peas. Lentils. Nuts and seeds. Dairy Fiber-fortified yogurt. Beverages Fiber-fortified soy milk. Fiber-fortified orange juice. Other foods Fiber bars. The items listed above may not be a complete list of recommended foods and beverages. Contact a dietitian for more information. What foods should I avoid? Fruits Fruit juice. Cooked, strained fruit. Vegetables Fried potatoes. Canned vegetables. Well-cooked vegetables. Grains White bread. Pasta made with refined flour. White rice. Meats and other proteins Fatty cuts of meat. Fried chicken or fried fish. Dairy Milk. Yogurt. Cream cheese. Sour cream. Fats and oils Butters. Beverages Soft drinks. Other foods Cakes and pastries. The items listed above may not be a complete list of foods and beverages to avoid. Talk with your dietitian about what choices are best for you. Summary  Fiber is a type of carbohydrate. It is  found in foods such as fruits, vegetables, whole grains, and beans.  A high-fiber diet has many benefits. It can help to prevent constipation, lower blood cholesterol, aid weight loss, and reduce your risk of heart disease, diabetes, and certain cancers.  Increase your intake of fiber gradually. Increasing fiber too quickly may cause cramping, bloating, and gas. Drink plenty of water while you increase the amount of fiber you consume.  The best sources of fiber include whole fruits and vegetables, whole grains, nuts, seeds, and beans. This information is not intended to replace advice given to you by your health care provider. Make sure you discuss any questions you have with your health care provider. Document Revised: 06/23/2019 Document Reviewed: 06/23/2019 Elsevier Patient Education  2021 [employer].

## 2020-05-14 NOTE — Telephone Encounter (Signed)
Pt scheduled in lipid clinic on 3/30.

## 2020-05-15 NOTE — Telephone Encounter (Signed)
Received completed form & faxed to [employer] & left [employer] papers at front desk for patient to pick up 05/15/20  [facility]

## 2020-05-29 ENCOUNTER — Encounter: Payer: Self-pay | Admitting: Dietician

## 2020-05-29 ENCOUNTER — Other Ambulatory Visit: Payer: Self-pay

## 2020-05-29 ENCOUNTER — Encounter: Payer: BC Managed Care – PPO | Attending: Interventional Cardiology | Admitting: Dietician

## 2020-05-29 DIAGNOSIS — Z794 Long term (current) use of insulin: Secondary | ICD-10-CM | POA: Diagnosis present

## 2020-05-29 DIAGNOSIS — E1165 Type 2 diabetes mellitus with hyperglycemia: Secondary | ICD-10-CM | POA: Insufficient documentation

## 2020-05-29 NOTE — Patient Instructions (Signed)
Check your blood sugar twice a day. Once first thing in the morning, and once two hours after you begin eating a meal.  Keep up the 150 minutes of physical activity each week.  Work towards eating three meals a day, about 5-6 hours apart!  Begin to recognize carbohydrates in your food choices!  Have 3 carb choices at each meal (45 g).   Begin to build your meals using the proportions of the Balanced Plate. . First, select your carb choice(s) for the meal, and determine how much you should have to equal 3 carb choices (45 g). . Next, select your source of protein to pair with your carb choice(s). . Finally, complete the remaining half of your meal with a variety of non-starchy vegetables.

## 2020-05-29 NOTE — Progress Notes (Signed)
Patient ID: Dan PIECH                 DOB: [birth year]                    MRN: [id number]     HPI: Dan Ford is a 50 y.o. male patient referred to lipid clinic by Dr. Irish Ford. PMH is significant for HTN, T2DM on insulin, severe hypertriglyceridemia, nonobstructive CAD, obesity, anxiety disorder who had acute inferior STEMI treated with PCI in 05/01/20. Cath showed distal RCA 100% stenosed, 0% s/p DES. LVEF 55-65%. A1c was 12% at this hospitalization.   Last seen by Dr. Irish Ford 3/14. Reported seeing nutritionist for his diabetes. History of pancreatitis from high TG in the past. Current medications were continued and referred to pharmacy clinic for further management.   Today, patient arrives in good spirits. Reports he has had pancreatitis 4 times in the past due to his elevated triglycerides, most recently about 2 years ago. He is adherent with rosuvastatin, fenofibrate, and Vascepa. He is seeing a nutritionist for his diabetes and triglycerides. Reports he has seen Dr. 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   Current Medications: rosuvastatin 40 mg daily, fenofibrate 160 mg daily, Vascepa 2g BID, OTC omega-3 fish oil Intolerances: N/A Risk Factors: HTN, HLD, CAD s/p PCI LDL goal: <55 mg/dL   Diet: Seeing a nutritionist for diabetes/triglycerides. Working on limiting carbs to 45 g per meal, eliminating carbs from snacks, no desserts or alcohol.   Exercise: Walks 30 minutes 5 days per week  Family History: The patient's family history includes Arrhythmia in his mother; Atrial fibrillation in his mother.   Social History: The patient  reports that he has quit smoking. He has never used smokeless tobacco. He reports that he does not drink alcohol and does not use drugs.   Labs: 05/02/20: TC 392, TG 2693, HDL 19, direct LDL <10 (on fenofibrate 160 mg, rosuvastatin 40 mg, Vascepa) 03/16/20: TC 656, TG >5000, HDL 25 11/15/19:  TC 418, TG 2637, HDL 26 07/15/19: TC 495, TG >5000, HDL 25  Past Medical History:  Diagnosis Date  . Coronary artery disease   . Diabetes mellitus without complication ([place])   . Family history of adverse reaction to anesthesia   . Hyperlipidemia   . Hypertension     Current Outpatient Medications on File Prior to Visit  Medication Sig Dispense Refill  . aspirin EC 81 MG tablet Take 1 tablet (81 mg total) by mouth daily. Swallow whole. 90 tablet 3  . dapagliflozin propanediol (FARXIGA) 10 MG TABS tablet Take 1 tablet (10 mg total) by mouth daily. 30 tablet 6  . escitalopram (LEXAPRO) 20 MG tablet Take 20 mg by mouth daily.    . fenofibrate 160 MG tablet Take 160 mg by mouth daily.    . insulin detemir (LEVEMIR) 100 UNIT/ML injection Inject 50 Units into the skin daily.    Marland Kitchen lisinopril (ZESTRIL) 10 MG tablet Take 10 mg by mouth daily.    . metoprolol succinate (TOPROL XL) 25 MG 24 hr tablet Take 0.5 tablets (12.5 mg total) by mouth daily. 30 tablet 3  . Multiple Vitamins-Minerals (MULTIVITAMIN MEN 50+ PO) Take by mouth.    . nitroGLYCERIN (NITROSTAT) 0.4 MG SL tablet Place 1 tablet (0.4 mg total) under the tongue every 5 (five) minutes x 3 doses as needed for chest pain. 25 tablet 12  . Omega-3 1000 MG CAPS Take 1,000  mg by mouth daily.    . pantoprazole (PROTONIX) 40 MG tablet Take 40 mg by mouth daily.    . rosuvastatin (CRESTOR) 40 MG tablet Take 40 mg by mouth daily.    . ticagrelor (BRILINTA) 90 MG TABS tablet Take 1 tablet (90 mg total) by mouth 2 (two) times daily. 60 tablet 11  . VASCEPA 1 g capsule Take 2 g by mouth 2 (two) times daily.     No current facility-administered medications on file prior to visit.    Allergies  Allergen Reactions  . Amoxicillin-Pot Clavulanate Other (See Comments)    Other reaction(s): Other (See Comments) Hx of Clostridium Difficile Enterocolitis on Augmentin Hx of Clostridium Difficile Enterocolitis on Augmentin Hx of Clostridium Difficile  Enterocolitis on Augmentin   . Amoxicillin Nausea Only  . Iodine-131 Other (See Comments)    Kidney problems    Assessment/Plan:  1. Hypertriglyceridemia - Most recent triglycerides of 2693 mg/dL is down from previous values >5000 mg/dL but is significantly elevated above goal <150 mg/dL. Patient is optimized on rosuvastatin 40 mg daily, fenofibrate 160 mg daily, and Vascepa 2g BID--will continue these. Discontinue over the counter fish oil as this is not having additional benefit on top of Vascepa. Will start Niaspan 1000 mg nightly. Educated him that he could take his aspirin prior to the Niaspan dose to help with flushing. Given A1c is elevated to 12% and now s/p STEMI, patient would benefit from GLP-1 agonist therapy. Controlling blood glucose will in turn help to lower triglycerides. Will start Ozempic 0.25 mg SQ weekly x4 weeks, then 0.5 mg weekly x4 weeks, then 1 mg weekly thereafter. Ozempic is on his formulary (no prior authorization needed). Provided patient with samples to cover the dose titration period. Discussed lifestyle modifications to help lower triglycerides and provided a handout. He is also seeing a nutritionist who is helping him track his carbs. Will check LFTs in 4 weeks after initiation of Niaspan and plan to increase the dose if LFTs are stable. Will call him at that time to see how he is tolerating Ozempic and send in 1 mg prescription at that time for him to start after the 0.5 mg. Will need to schedule for 3 month fasting lipid panel and LFTs at that time as well. Pt would also benefit from limiting daily fat intake to < 15-20 grams and avoiding alcohol and simple carbs given history of familial chylomicronemia syndrome.    Provided patient with refills and 90 day supplies for all medications that he had filled at the [facility] at discharge. Also informed him of the Vascepa co-pay card for the future to bring the cost down to $9 for a 90 day  supply (says he has met his out of pocket costs this year so it is free right now but was expensive prior).   Dan Ford, PharmD PGY1 Pharmacy Resident 05/30/2020 10:22 AM

## 2020-05-29 NOTE — Progress Notes (Signed)
Diabetes Self-Management Education  Visit Type: First/Initial  Appt. Start Time: 0800 Appt. End Time: 0900  05/29/2020  Mr. Dan Ford, identified by name and date of birth, is a 50 y.o. male with a diagnosis of Diabetes: Type 2.   ASSESSMENT Pt reports having a heart attack on March 1st. Pt states they went to bed and felt indigestion, pressure kept building and pt wife called the ambulance. 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 Pt reports familial hypercholesterolemia (TC- 392, TGL - 2,693, HDL - 19) Pt is beginning cardiac rehab on April 19th. Pt reports having lows (5) after heart attack, doctor lowered their Levemir  From 50 units to 30 units. Pt experienced lightheadedness, queasiness. Lowest BG was 41. Pt reports eating a piece of peppermint to raise blood sugar back up. Last hypoglycemia was this past Saturday (05/26/20) of 62. Pt reports eating a diet of lean meat, vegetables, and whole grains. Pt reports trying to figure out the right foods to eat and the right portions to eat. Pt uses Freestyle Libre2 CGM. Pt reports FBG of 110-150 Pt reports CBG 170-210 Pt reports exercising for 30 minutes, 5 days a week. Pt walks for physical activity.  Height 5\' 10"  (1.778 m), weight 221 lb (100.2 kg). Body mass index is 31.71 kg/m.   Diabetes Self-Management Education - 05/29/20 0815      Visit Information   Visit Type First/Initial      Initial Visit   Diabetes Type Type 2    Are you currently following a meal plan? Yes    What type of meal plan do you follow? Lean meats, vegetables, whole grains    Are you taking your medications as prescribed? Yes    Date Diagnosed 6345 @ 50 years old      Health Coping   How would you rate your overall health? Good      Psychosocial Assessment   Patient Belief/Attitude about Diabetes Motivated to manage diabetes    Self-management support Doctor's office;Family    Other persons  present Patient    Patient Concerns Nutrition/Meal planning;Glycemic Control;Healthy Lifestyle    Special Needs None    Preferred Learning Style No preference indicated    Learning Readiness Ready    How often do you need to have someone help you when you read instructions, pamphlets, or other written materials from your doctor or pharmacy? 1 - Never    What is the last grade level you completed in school? Masters Degree      Pre-Education Assessment   Patient understands the diabetes disease and treatment process. Needs Instruction    Patient understands incorporating nutritional management into lifestyle. Needs Instruction    Patient undertands incorporating physical activity into lifestyle. Needs Instruction    Patient understands using medications safely. Needs Instruction    Patient understands monitoring blood glucose, interpreting and using results Needs Instruction    Patient understands prevention, detection, and treatment of acute complications. Needs Instruction    Patient understands prevention, detection, and treatment of chronic complications. Needs Instruction    Patient understands how to develop strategies to address psychosocial issues. Needs Instruction    Patient understands how to develop strategies to promote health/change behavior. Needs Instruction      Complications   Last HgB A1C per patient/outside source 12 %   05/02/2020   How often do you check your blood sugar? > 4 times/day   FreeStyle Libre CGM  Fasting Blood glucose range (mg/dL) 60-630    Postprandial Blood glucose range (mg/dL) 160-109    Number of hypoglycemic episodes per month 5    Can you tell when your blood sugar is low? Yes    What do you do if your blood sugar is low? eat a peppermint    Number of hyperglycemic episodes per week 0    Have you had a dilated eye exam in the past 12 months? Yes    Have you had a dental exam in the past 12 months? Yes    Are you checking your feet? Yes    How many  days per week are you checking your feet? 3      Dietary Intake   Breakfast Oatmeal with blueberries    Snack (morning) none    Lunch Tuna salad wrap, extreme wellness wraps    Snack (afternoon) none    Dinner Chicken soup with beans, green beans, peas    Snack (evening) dill pickles    Beverage(s) water ~64 oz      Exercise   Exercise Type ADL's;Light (walking / raking leaves)    How many days per week to you exercise? 5    How many minutes per day do you exercise? 30    Total minutes per week of exercise 150      Patient Education   Previous Diabetes Education No    Disease state  Factors that contribute to the development of diabetes;Definition of diabetes, type 1 and 2, and the diagnosis of diabetes    Nutrition management  Carbohydrate counting;Food label reading, portion sizes and measuring food.;Role of diet in the treatment of diabetes and the relationship between the three main macronutrients and blood glucose level    Physical activity and exercise  Role of exercise on diabetes management, blood pressure control and cardiac health.;Helped patient identify appropriate exercises in relation to his/her diabetes, diabetes complications and other health issue.    Medications Reviewed patients medication for diabetes, action, purpose, timing of dose and side effects.    Monitoring Purpose and frequency of SMBG.;Taught/discussed recording of test results and interpretation of SMBG.    Acute complications Taught treatment of hypoglycemia - the 15 rule.    Chronic complications Lipid levels, blood glucose control and heart disease    Psychosocial adjustment Role of stress on diabetes    Personal strategies to promote health Lifestyle issues that need to be addressed for better diabetes care      Individualized Goals (developed by patient)   Nutrition Follow meal plan discussed    Physical Activity Exercise 3-5 times per week    Medications take my medication as prescribed     Monitoring  test my blood glucose as discussed    Reducing Risk treat hypoglycemia with 15 grams of carbs if blood glucose less than 70mg /dL;examine blood glucose patterns;increase portions of healthy fats      Post-Education Assessment   Patient understands the diabetes disease and treatment process. Needs Review    Patient understands incorporating nutritional management into lifestyle. Needs Review    Patient undertands incorporating physical activity into lifestyle. Needs Review    Patient understands using medications safely. Needs Review    Patient understands monitoring blood glucose, interpreting and using results Needs Review    Patient understands prevention, detection, and treatment of acute complications. Needs Review    Patient understands prevention, detection, and treatment of chronic complications. Needs Review    Patient understands how to develop  strategies to address psychosocial issues. Needs Review    Patient understands how to develop strategies to promote health/change behavior. Needs Review      Outcomes   Expected Outcomes Demonstrated interest in learning. Expect positive outcomes    Future DMSE 4-6 wks    Program Status Not Completed           Individualized Plan for Diabetes Self-Management Training:   Learning Objective:  Patient will have a greater understanding of diabetes self-management. Patient education plan is to attend individual and/or group sessions per assessed needs and concerns.   Plan:   Patient Instructions  Check your blood sugar twice a day. Once first thing in the morning, and once two hours after you begin eating a meal.  Keep up the 150 minutes of physical activity each week.  Work towards eating three meals a day, about 5-6 hours apart!  Begin to recognize carbohydrates in your food choices!  Have 3 carb choices at each meal (45 g).   Begin to build your meals using the proportions of the Balanced Plate. . First, select your  carb choice(s) for the meal, and determine how much you should have to equal 3 carb choices (45 g). . Next, select your source of protein to pair with your carb choice(s). . Finally, complete the remaining half of your meal with a variety of non-starchy vegetables.    Expected Outcomes:  Demonstrated interest in learning. Expect positive outcomes  Education material provided: Meal plan card and My Plate  If problems or questions, patient to contact team via:  Phone and Email  Future DSME appointment: 4-6 wks

## 2020-05-30 ENCOUNTER — Ambulatory Visit (INDEPENDENT_AMBULATORY_CARE_PROVIDER_SITE_OTHER): Payer: BC Managed Care – PPO | Admitting: Student-PharmD

## 2020-05-30 DIAGNOSIS — E782 Mixed hyperlipidemia: Secondary | ICD-10-CM | POA: Diagnosis not present

## 2020-05-30 MED ORDER — TICAGRELOR 90 MG PO TABS: 90.0000 mg | ORAL_TABLET | Freq: Two times a day (BID) | ORAL | 3 refills | Status: DC

## 2020-05-30 MED ORDER — DAPAGLIFLOZIN PROPANEDIOL 10 MG PO TABS: 10.0000 mg | ORAL_TABLET | Freq: Every day | ORAL | 3 refills | Status: DC

## 2020-05-30 MED ORDER — FENOFIBRATE 160 MG PO TABS: 160.0000 mg | ORAL_TABLET | Freq: Every day | ORAL | 3 refills | Status: DC

## 2020-05-30 MED ORDER — LISINOPRIL 10 MG PO TABS: 10.0000 mg | ORAL_TABLET | Freq: Every day | ORAL | 3 refills | Status: DC

## 2020-05-30 MED ORDER — METOPROLOL SUCCINATE ER 25 MG PO TB24: 12.5000 mg | ORAL_TABLET | Freq: Every day | ORAL | 3 refills | Status: DC

## 2020-05-30 MED ORDER — ASPIRIN EC 81 MG PO TBEC: 81.0000 mg | DELAYED_RELEASE_TABLET | Freq: Every day | ORAL | 3 refills | Status: AC

## 2020-05-30 MED ORDER — NIACIN ER (ANTIHYPERLIPIDEMIC) 1000 MG PO TBCR
1000.0000 mg | EXTENDED_RELEASE_TABLET | Freq: Every day | ORAL | 2 refills | Status: DC
Start: 2020-05-30 — End: 2020-06-28

## 2020-05-30 MED ORDER — ROSUVASTATIN CALCIUM 40 MG PO TABS: 40.0000 mg | ORAL_TABLET | Freq: Every day | ORAL | 3 refills | Status: DC

## 2020-05-30 NOTE — Patient Instructions (Addendum)
Nice to see you today!  Keep up the good work with diet and exercise. Aim for a diet full of vegetables, fruit and lean meats (chicken, [place], fish). Try to limit carbs (bread, pasta, sugar, rice) and red meat consumption.  Your goal triglycerides is less than 150 mg/dL, you're currently at 4481 mg/dL  Medication Changes: Begin Niaspan 1000 mg nightly. You can take your aspirin 30 minutes to an hour before the Niaspan to help with flushing.   Continue rosuvastatin 40 mg daily, fenofibrate 160 mg daily, and Vascepa 2g twice daily  Start taking Ozempic 0.25 mg once weekly. Inject this into the fatty tissue of your abdomen. You will take the 0.25 mg dose weekly x4 weeks, then increase to 0.5 mg once weekly x4 weeks, then increase to 1 mg weekly thereafter.   Return for lab work in 4 weeks on April 27th (our lab opens at 7:30am and you can come anytime after that).   We will call you with the results of that lab work and titrate the dose of the Niaspan. We will also check in to see how you're doing on the Ozempic prior to your dose increase to 0.5 mg at this time. We will schedule you for follow up lipid lab work at that time.   If/when your Vascepa price increases, you can google Vascepa co-pay card and fill out the information on the website, print the co-pay card and give it to your pharmacy to enter. It should bring the cost down to $9 for a 90 day supply.   Please give [place] a call at [phone] with any questions or concerns.

## 2020-06-07 ENCOUNTER — Telehealth: Payer: Self-pay | Admitting: *Deleted

## 2020-06-07 NOTE — Telephone Encounter (Signed)
Note recent inferior STEMI and stent placement on 05/01/2020.  Patient would be at high risk for stent thrombosis coming off of antiplatelet therapy at this time.  Call back pool, please check with requesting office to see if the surgery is urgent or can it be pushed back. Typically, we recommend hold off on surgery for at least 6 months after the stent placement, if the surgery is really nonurgent, ideally a need to be held off for 12 months after the stent placement.

## 2020-06-07 NOTE — Telephone Encounter (Signed)
   [facility] Pre-operative Risk Assessment    Patient Name: Dan Ford  DOB: 09-01-1970  MRN: [id number]   [place]: - Please ensure there is not already an duplicate clearance open for this procedure. - Under Visit Info/Reason for Call, type in Other and utilize the format Clearance MM/DD/YY or Clearance TBD. Do not use dashes or single digits. - If request is for dental extraction, please clarify the # of teeth to be extracted.  Request for surgical clearance:  1. What type of surgery is being performed? COLONOSCOPY/EGD   2. When is this surgery scheduled? TBD   3. What type of clearance is required (medical clearance vs. Pharmacy clearance to hold med vs. Both)? MEDICAL  4. Are there any medications that need to be held prior to surgery and how long? BRILINTA  X 5 DAYS PRIOR   5. Practice name and name of physician performing surgery? [facility]; DR. LE   6. What is the office phone number? [phone]   7.   What is the office fax number? [phone]  8.   Anesthesia type (None, local, MAC, general) ? NOT LISTED   Julaine Hua 06/07/2020, 4:53 PM  _________________________________________________________________   (provider comments below)

## 2020-06-08 NOTE — Telephone Encounter (Signed)
Fax was received

## 2020-06-08 NOTE — Telephone Encounter (Signed)
Called the requesting office and spoke with [facility]. I asked if the colonoscopy was urgent if not and the reason for me asking was that patient had a recent inferior STEMI and stent placement on 05/01/20 and that patient would be considered high risk for stent thrombosis coming off the antiplatelet therapy at this time. She looked at his office note and labs she stated that the procedure is not urgent. I informed her I will pass the information to the pool and a formal non clearance and/or clearance will be faxed to their office. Rene Kocher gave me the fax number of [phone] to fax over the clearance.

## 2020-06-08 NOTE — Telephone Encounter (Signed)
Printed out clearance and faxed it to the requesting office to the Attn: [facility]. I will call the office at a later time to make sure fax was received.

## 2020-06-08 NOTE — Telephone Encounter (Signed)
   Patient Name: ABHINAV MAYORQUIN  DOB: [birth year]  MRN: [id number]   Primary Cardiologist: Lance Muss, MD  Chart reviewed as part of pre-operative protocol coverage. Patient was contacted 06/08/2020 in reference to pre-operative risk assessment for pending surgery as outlined below.  MAURISIO RUDDY was last seen on 05/14/2020 by Dr. Eldridge Dace.    Patient presented to the hospital on 05/01/2020 with acute inferior STEMI.  He had 100% distal RCA occlusion that was treated with a resolute Onyx 2.5 x 18 mm drug-eluting stent postdilated to 3.0 mm.  He is currently on aspirin and Brilinta.  Ideally dual antiplatelet therapy should be continued for 1 year, although in certain cases, more urgent surgery can be done 6 months after the stent placement.  Coming off of antiplatelet therapy within the for 93-month is not recommended due to high risk of stent thrombosis.  We recommend delay the surgery until at least 6 months or greater after the date of last stent placement (05/01/2020).   [place], [place] 06/08/2020, 10:10 AM

## 2020-06-25 ENCOUNTER — Ambulatory Visit: Payer: BC Managed Care – PPO | Admitting: Endocrinology

## 2020-06-27 ENCOUNTER — Other Ambulatory Visit: Payer: BC Managed Care – PPO | Admitting: *Deleted

## 2020-06-27 ENCOUNTER — Other Ambulatory Visit: Payer: Self-pay

## 2020-06-27 DIAGNOSIS — E782 Mixed hyperlipidemia: Secondary | ICD-10-CM

## 2020-06-27 LAB — HEPATIC FUNCTION PANEL
ALT: 24 IU/L (ref 0–44)
AST: 25 IU/L (ref 0–40)
Albumin: 4.5 g/dL (ref 3.8–4.9)
Alkaline Phosphatase: 80 IU/L (ref 44–121)
Bilirubin Total: 0.3 mg/dL (ref 0.0–1.2)
Bilirubin, Direct: 0.1 mg/dL (ref 0.00–0.40)
Total Protein: 7.5 g/dL (ref 6.0–8.5)

## 2020-06-28 ENCOUNTER — Telehealth: Payer: Self-pay | Admitting: Pharmacist

## 2020-06-28 DIAGNOSIS — Z794 Long term (current) use of insulin: Secondary | ICD-10-CM

## 2020-06-28 DIAGNOSIS — E782 Mixed hyperlipidemia: Secondary | ICD-10-CM

## 2020-06-28 DIAGNOSIS — E1165 Type 2 diabetes mellitus with hyperglycemia: Secondary | ICD-10-CM

## 2020-06-28 MED ORDER — NIACIN ER (ANTIHYPERLIPIDEMIC) 1000 MG PO TBCR: 2000.0000 mg | EXTENDED_RELEASE_TABLET | Freq: Every day | ORAL | 3 refills | Status: DC

## 2020-06-28 NOTE — Telephone Encounter (Signed)
Left message for pt to discuss lab results.  LFTs stable since starting Niaspan 1g for severe hypertriglyceridemia. Will further increase Niaspan dose from 1g to 2g HS. Will also see how pt is tolerating Ozempic 0.25mg  weekly, plan to titrate to 0.5mg  dose if tolerating well (has sample for dose titration).

## 2020-06-28 NOTE — Addendum Note (Signed)
Addended by: Ilyssa Grennan E on: 06/28/2020 01:17 PM   Modules accepted: Orders

## 2020-06-28 NOTE — Addendum Note (Signed)
Addended by: Marlisha Vanwyk E on: 06/28/2020 01:19 PM   Modules accepted: Orders

## 2020-06-28 NOTE — Telephone Encounter (Addendum)
Spoke with pt regarding stable LFTs since starting Niaspan; he's tolerating therapy well with minimal flushing. Will increase to 2g at night for further TG lowering, and will continue rosuvastatin 40mg  daily, fenofibrate 160mg  daily, and Vascepa 2g BID.   He's also tolerating Ozempic well. Will increase to 0.5mg  once weekly for the next month to continue with his dose titration. Will call pt in 1 month and plan to increase to 1mg  for A1c lowering and subsequent TG benefit as well.  Scheduled follow up lipids, LFTs, and direct LDL in 2 months before next visit with Dr . TG meds will be optimized at that point, although still anticipate TG will remain elevated given patient's baseline TG > 5,000 and history of familial chylomicronemia syndrome.

## 2020-07-03 ENCOUNTER — Ambulatory Visit: Payer: BC Managed Care – PPO | Admitting: Dietician

## 2020-07-20 ENCOUNTER — Other Ambulatory Visit: Payer: Self-pay

## 2020-07-20 ENCOUNTER — Inpatient Hospital Stay (HOSPITAL_COMMUNITY): Payer: BC Managed Care – PPO

## 2020-07-20 ENCOUNTER — Emergency Department (HOSPITAL_COMMUNITY): Payer: BC Managed Care – PPO

## 2020-07-20 ENCOUNTER — Inpatient Hospital Stay (HOSPITAL_COMMUNITY)
Admission: EM | Admit: 2020-07-20 | Discharge: 2020-07-25 | DRG: 439 | Disposition: A | Discharge status: Home / Self Care | Payer: BC Managed Care – PPO | Attending: Internal Medicine | Admitting: Internal Medicine

## 2020-07-20 ENCOUNTER — Encounter (HOSPITAL_COMMUNITY): Payer: Self-pay

## 2020-07-20 DIAGNOSIS — Z9861 Coronary angioplasty status: Secondary | ICD-10-CM

## 2020-07-20 DIAGNOSIS — K8500 Idiopathic acute pancreatitis without necrosis or infection: Secondary | ICD-10-CM | POA: Diagnosis not present

## 2020-07-20 DIAGNOSIS — E860 Dehydration: Secondary | ICD-10-CM | POA: Diagnosis present

## 2020-07-20 DIAGNOSIS — I9589 Other hypotension: Secondary | ICD-10-CM | POA: Diagnosis present

## 2020-07-20 DIAGNOSIS — K858 Other acute pancreatitis without necrosis or infection: Secondary | ICD-10-CM

## 2020-07-20 DIAGNOSIS — E785 Hyperlipidemia, unspecified: Secondary | ICD-10-CM | POA: Diagnosis present

## 2020-07-20 DIAGNOSIS — Z7982 Long term (current) use of aspirin: Secondary | ICD-10-CM | POA: Diagnosis not present

## 2020-07-20 DIAGNOSIS — E1169 Type 2 diabetes mellitus with other specified complication: Secondary | ICD-10-CM | POA: Diagnosis present

## 2020-07-20 DIAGNOSIS — E663 Overweight: Secondary | ICD-10-CM | POA: Diagnosis present

## 2020-07-20 DIAGNOSIS — E871 Hypo-osmolality and hyponatremia: Secondary | ICD-10-CM | POA: Diagnosis present

## 2020-07-20 DIAGNOSIS — Z9049 Acquired absence of other specified parts of digestive tract: Secondary | ICD-10-CM | POA: Diagnosis not present

## 2020-07-20 DIAGNOSIS — Z888 Allergy status to other drugs, medicaments and biological substances status: Secondary | ICD-10-CM

## 2020-07-20 DIAGNOSIS — E876 Hypokalemia: Secondary | ICD-10-CM | POA: Diagnosis not present

## 2020-07-20 DIAGNOSIS — K861 Other chronic pancreatitis: Secondary | ICD-10-CM | POA: Diagnosis present

## 2020-07-20 DIAGNOSIS — E782 Mixed hyperlipidemia: Secondary | ICD-10-CM | POA: Diagnosis present

## 2020-07-20 DIAGNOSIS — R6510 Systemic inflammatory response syndrome (SIRS) of non-infectious origin without acute organ dysfunction: Secondary | ICD-10-CM | POA: Diagnosis present

## 2020-07-20 DIAGNOSIS — I10 Essential (primary) hypertension: Secondary | ICD-10-CM | POA: Diagnosis present

## 2020-07-20 DIAGNOSIS — Z20822 Contact with and (suspected) exposure to covid-19: Secondary | ICD-10-CM | POA: Diagnosis present

## 2020-07-20 DIAGNOSIS — G4733 Obstructive sleep apnea (adult) (pediatric): Secondary | ICD-10-CM | POA: Diagnosis present

## 2020-07-20 DIAGNOSIS — Z87891 Personal history of nicotine dependence: Secondary | ICD-10-CM | POA: Diagnosis not present

## 2020-07-20 DIAGNOSIS — I2510 Atherosclerotic heart disease of native coronary artery without angina pectoris: Secondary | ICD-10-CM | POA: Diagnosis present

## 2020-07-20 DIAGNOSIS — K859 Acute pancreatitis without necrosis or infection, unspecified: Secondary | ICD-10-CM | POA: Diagnosis present

## 2020-07-20 DIAGNOSIS — Z794 Long term (current) use of insulin: Secondary | ICD-10-CM | POA: Diagnosis not present

## 2020-07-20 DIAGNOSIS — Z881 Allergy status to other antibiotic agents status: Secondary | ICD-10-CM

## 2020-07-20 DIAGNOSIS — E781 Pure hyperglyceridemia: Secondary | ICD-10-CM | POA: Diagnosis present

## 2020-07-20 DIAGNOSIS — Z6828 Body mass index (BMI) 28.0-28.9, adult: Secondary | ICD-10-CM

## 2020-07-20 DIAGNOSIS — E1165 Type 2 diabetes mellitus with hyperglycemia: Secondary | ICD-10-CM | POA: Diagnosis present

## 2020-07-20 DIAGNOSIS — K529 Noninfective gastroenteritis and colitis, unspecified: Secondary | ICD-10-CM | POA: Diagnosis present

## 2020-07-20 DIAGNOSIS — R112 Nausea with vomiting, unspecified: Secondary | ICD-10-CM

## 2020-07-20 DIAGNOSIS — I959 Hypotension, unspecified: Secondary | ICD-10-CM

## 2020-07-20 DIAGNOSIS — R197 Diarrhea, unspecified: Secondary | ICD-10-CM | POA: Diagnosis not present

## 2020-07-20 DIAGNOSIS — I252 Old myocardial infarction: Secondary | ICD-10-CM | POA: Diagnosis not present

## 2020-07-20 DIAGNOSIS — Z79899 Other long term (current) drug therapy: Secondary | ICD-10-CM | POA: Diagnosis not present

## 2020-07-20 DIAGNOSIS — E861 Hypovolemia: Secondary | ICD-10-CM | POA: Diagnosis present

## 2020-07-20 DIAGNOSIS — R000 Tachycardia, unspecified: Secondary | ICD-10-CM | POA: Diagnosis present

## 2020-07-20 LAB — CBC WITH DIFFERENTIAL/PLATELET
Abs Immature Granulocytes: 0.03 10*3/uL (ref 0.00–0.07)
Basophils Absolute: 0 10*3/uL (ref 0.0–0.1)
Basophils Relative: 0 %
Eosinophils Absolute: 0.3 10*3/uL (ref 0.0–0.5)
Eosinophils Relative: 3 %
HCT: 49.5 % (ref 39.0–52.0)
Hemoglobin: 16.9 g/dL (ref 13.0–17.0)
Immature Granulocytes: 0 %
Lymphocytes Relative: 16 %
Lymphs Abs: 1.7 10*3/uL (ref 0.7–4.0)
MCH: 29 pg (ref 26.0–34.0)
MCHC: 34.1 g/dL (ref 30.0–36.0)
MCV: 85.1 fL (ref 80.0–100.0)
Monocytes Absolute: 0.6 10*3/uL (ref 0.1–1.0)
Monocytes Relative: 6 %
Neutro Abs: 8.3 10*3/uL — ABNORMAL HIGH (ref 1.7–7.7)
Neutrophils Relative %: 75 %
Platelets: 389 10*3/uL (ref 150–400)
RBC: 5.82 MIL/uL — ABNORMAL HIGH (ref 4.22–5.81)
RDW: 13.6 % (ref 11.5–15.5)
WBC: 11 10*3/uL — ABNORMAL HIGH (ref 4.0–10.5)
nRBC: 0 % (ref 0.0–0.2)

## 2020-07-20 LAB — LIPASE, BLOOD: Lipase: 30 U/L (ref 11–51)

## 2020-07-20 LAB — RESP PANEL BY RT-PCR (FLU A&B, COVID) ARPGX2
Influenza A by PCR: NEGATIVE
Influenza B by PCR: NEGATIVE
SARS Coronavirus 2 by RT PCR: NEGATIVE

## 2020-07-20 LAB — PROTIME-INR
INR: 1.1 (ref 0.8–1.2)
Prothrombin Time: 14.4 seconds (ref 11.4–15.2)

## 2020-07-20 LAB — COMPREHENSIVE METABOLIC PANEL
ALT: 29 U/L (ref 0–44)
AST: 24 U/L (ref 15–41)
Albumin: 3.9 g/dL (ref 3.5–5.0)
Alkaline Phosphatase: 59 U/L (ref 38–126)
Anion gap: 13 (ref 5–15)
BUN: 27 mg/dL — ABNORMAL HIGH (ref 6–20)
CO2: 19 mmol/L — ABNORMAL LOW (ref 22–32)
Calcium: 9.3 mg/dL (ref 8.9–10.3)
Chloride: 97 mmol/L — ABNORMAL LOW (ref 98–111)
Creatinine, Ser: 1.15 mg/dL (ref 0.61–1.24)
GFR, Estimated: >60 mL/min (ref >60)
Glucose, Bld: 223 mg/dL — ABNORMAL HIGH (ref 70–99)
Potassium: 4.1 mmol/L (ref 3.5–5.1)
Sodium: 129 mmol/L — ABNORMAL LOW (ref 135–145)
Total Bilirubin: 1 mg/dL (ref 0.3–1.2)
Total Protein: 7.6 g/dL (ref 6.5–8.1)

## 2020-07-20 LAB — APTT: aPTT: 28 seconds (ref 24–36)

## 2020-07-20 LAB — LACTIC ACID, PLASMA: Lactic Acid, Venous: 1.4 mmol/L (ref 0.5–1.9)

## 2020-07-20 IMAGING — CT CT ABD-PELV W/O CM
2 of 4 series · 16 of 46 positions shown, 18 images · non-contrast
Comparison: None.

CLINICAL DATA: Acute generalized abdominal pain.

EXAM:
CT ABDOMEN AND PELVIS WITHOUT CONTRAST
TECHNIQUE: Multidetector CT imaging of the abdomen and pelvis was performed
following the standard protocol without IV contrast.

[Series 3: a/p w/o 5mm · axial · non-contrast · 0.82mm/px · z∈[+626,+1082]mm · 13 of 106 slices shown, 15 images]
[im 8/106  soft-tissue]
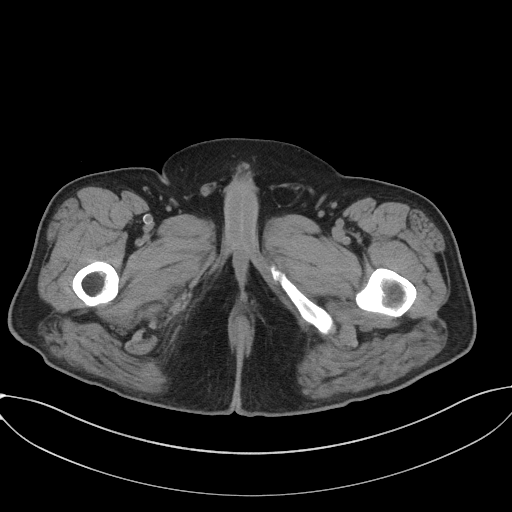
[im 8/106  bone]
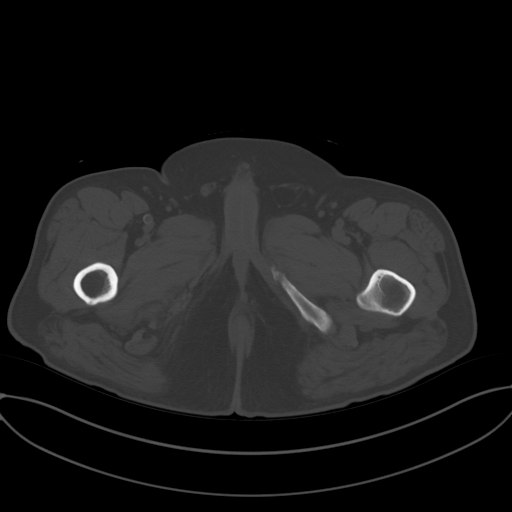
[im 15/106  soft-tissue]
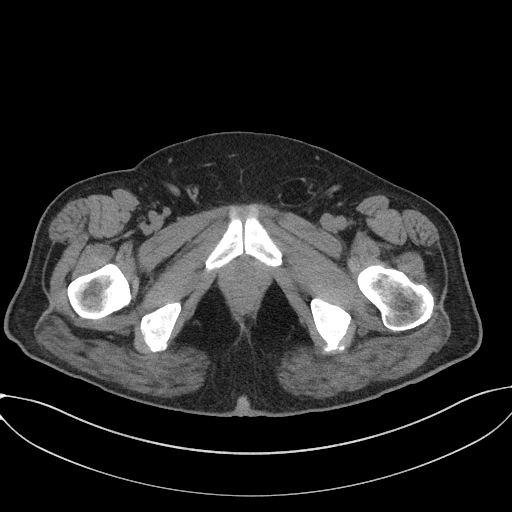
[im 22/106  soft-tissue]
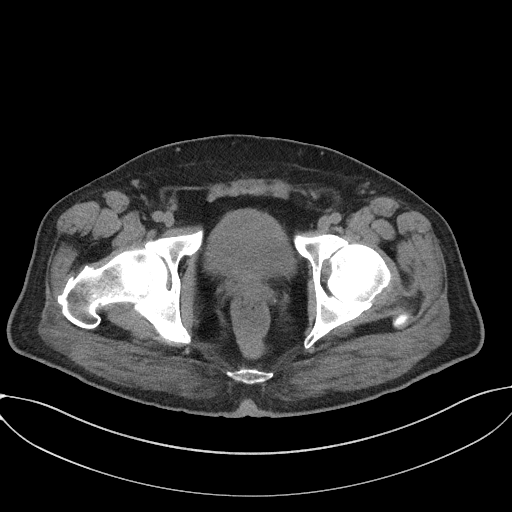
[im 29/106  soft-tissue]
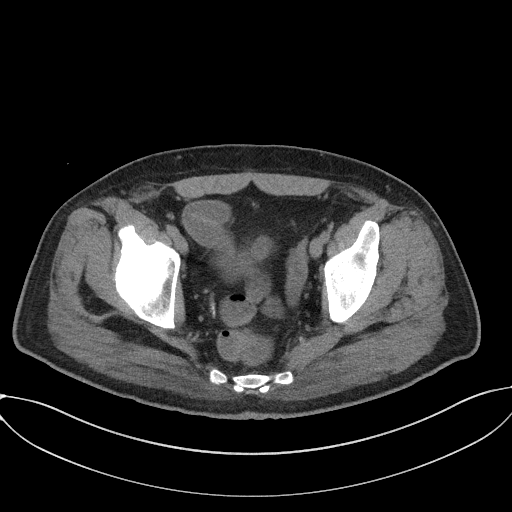
[im 36/106  soft-tissue]
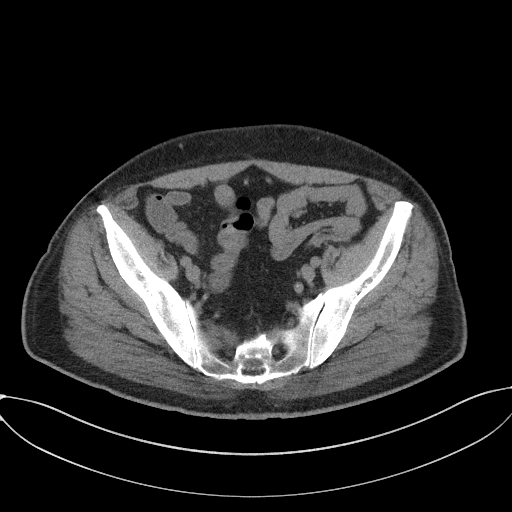
[im 43/106  soft-tissue]
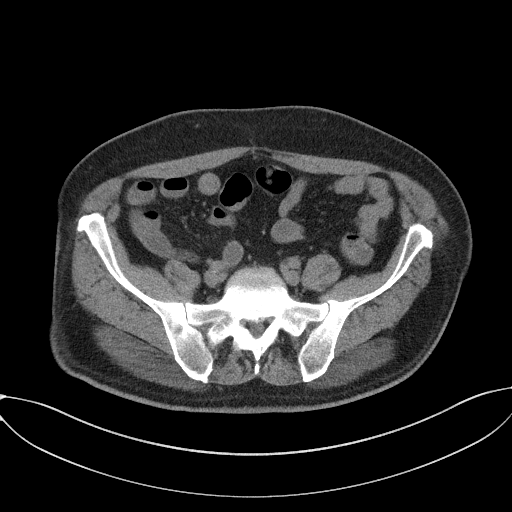
[im 57/106  soft-tissue]
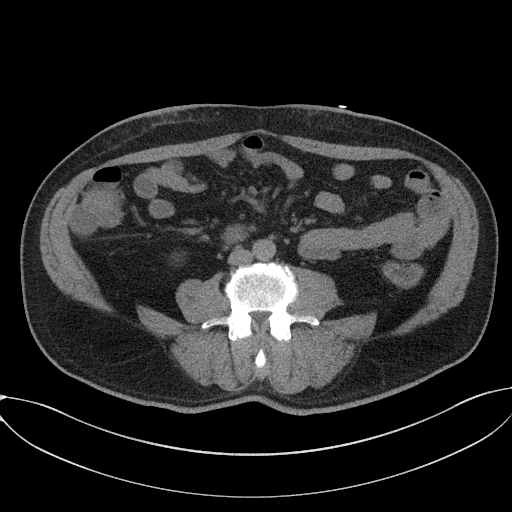
[im 64/106  soft-tissue]
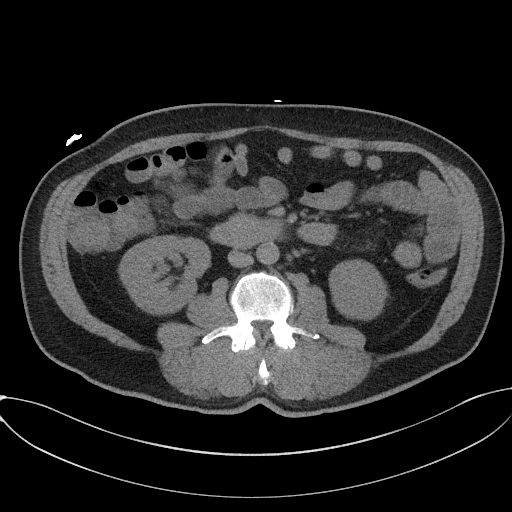
[im 71/106  soft-tissue]
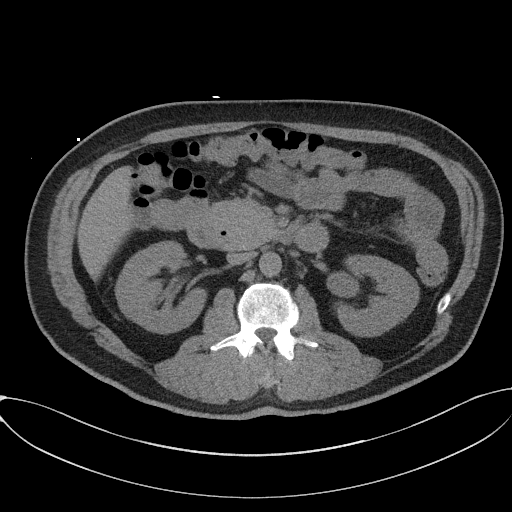
[im 71/106  bone]
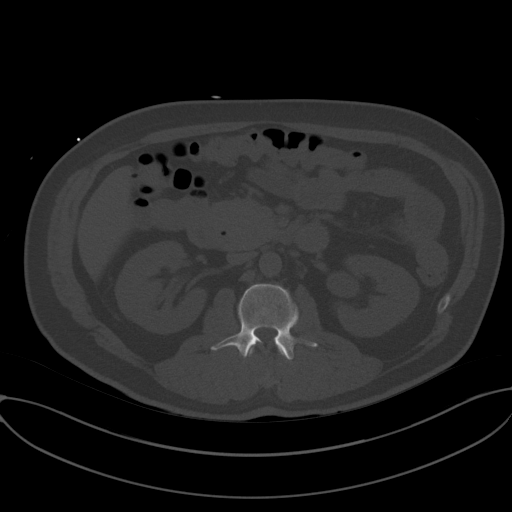
[im 78/106  soft-tissue]
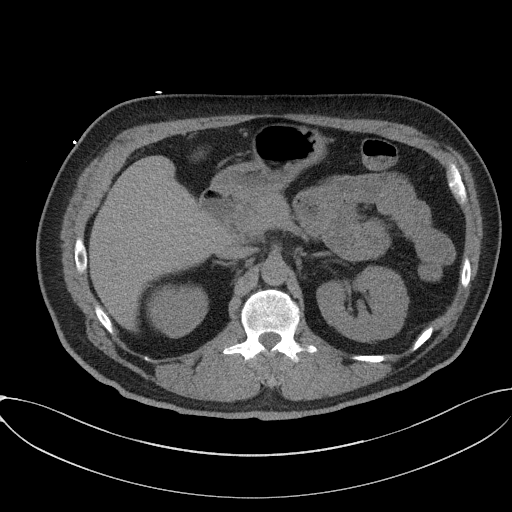
[im 85/106  soft-tissue]
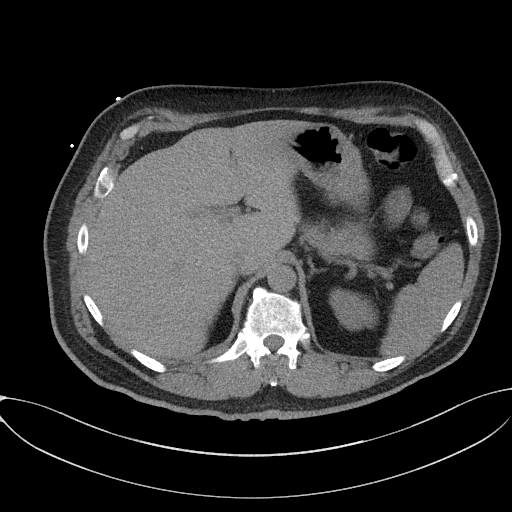
[im 92/106  soft-tissue]
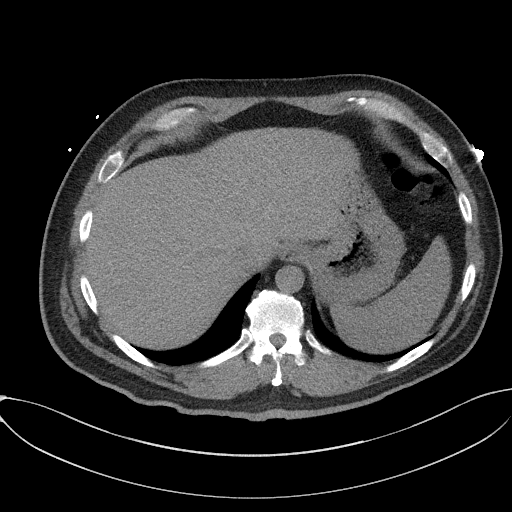
[im 99/106  soft-tissue]
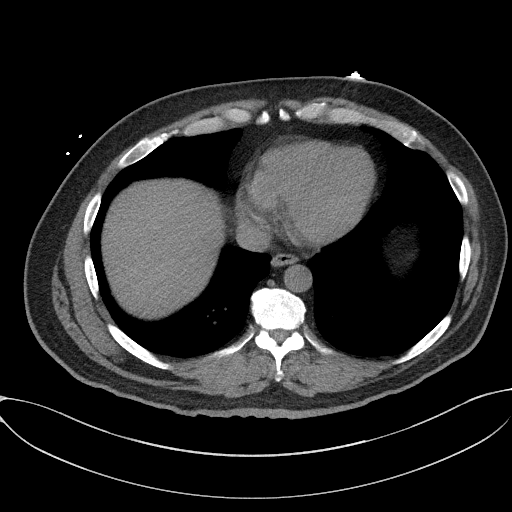

[Series 6: a/p w/o cor · coronal · non-contrast · 0.96mm/px · 3 of 149 slices shown]
[im 50/149  soft-tissue]
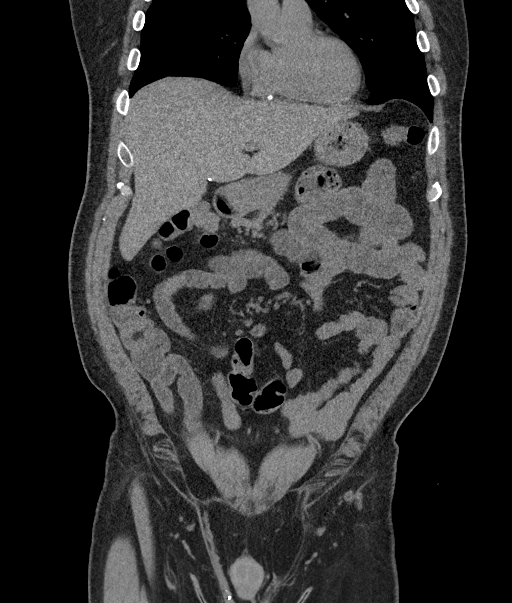
[im 66/149  soft-tissue]
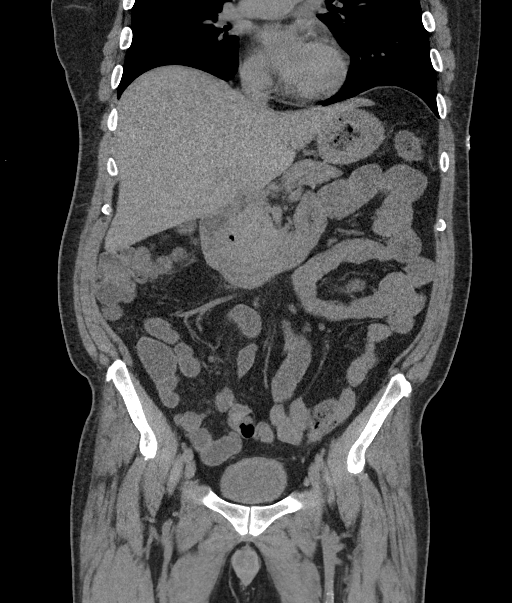
[im 83/149  soft-tissue]
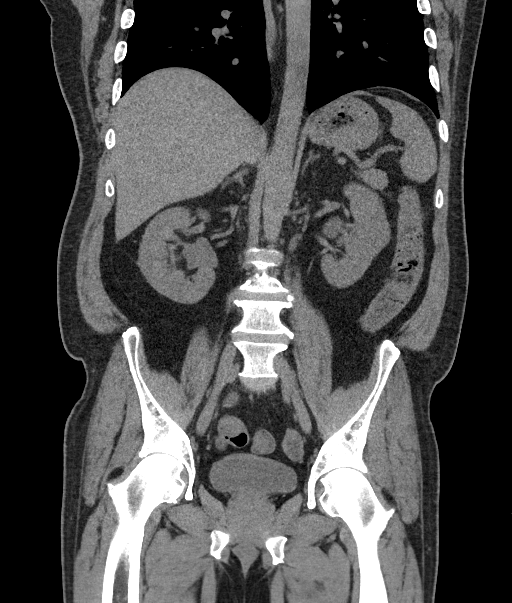

[16 of 46 positions shown; findings below may reference images not displayed]

FINDINGS: Lower chest: No acute abnormality.

Hepatobiliary: No focal liver abnormality is seen. Status post
cholecystectomy. No biliary dilatation.

Pancreas: Prominence of the pancreatic head is noted without ductal
dilatation. Is uncertain if this represents possible acute
pancreatitis or less likely mass.

Spleen: Normal in size without focal abnormality.

Adrenals/Urinary Tract: Adrenal glands are unremarkable. Kidneys are
normal, without renal calculi, focal lesion, or hydronephrosis.
Bladder is unremarkable.

Stomach/Bowel: The stomach appears normal. There is no evidence of
bowel obstruction or inflammation. Status post appendectomy.

Vascular/Lymphatic: Aortic atherosclerosis. No enlarged abdominal or
pelvic lymph nodes.

Reproductive: Prostate is unremarkable.

Other: No abdominal wall hernia or abnormality. No abdominopelvic
ascites.

Musculoskeletal: No acute or significant osseous findings.
IMPRESSION: Prominence of the pancreatic head is noted on this unenhanced scan.
It is uncertain if this represents acute pancreatitis or potentially
pancreatic mass. Further evaluation with MRI or CT scan with
intravenous contrast is recommended.

Aortic Atherosclerosis ([2G]-[2G]).

## 2020-07-20 IMAGING — MR MR ABDOMEN WO/W CM MRCP
15 of 23 series · 23 of 48 positions shown · IV contrast (gadavist)
Comparison: CT of the abdomen and pelvis [DATE].

CLINICAL DATA: Generalized abdominal pain, pancreatic abnormality
in a 50-year-old male.

EXAM:
MRI ABDOMEN WITHOUT AND WITH CONTRAST (INCLUDING MRCP)
TECHNIQUE: Multiplanar multisequence MR imaging of the abdomen was performed
both before and after the administration of intravenous contrast.
Heavily T2-weighted images of the biliary and pancreatic ducts were
obtained, and three-dimensional MRCP images were rendered by post
processing.
CONTRAST:  10mL GADAVIST GADOBUTROL 1 MMOL/ML IV SOLN

[Series 2: cor ssfse nav · coronal · 6.0mm · 0.78mm/px · 1 of 45 slices shown]
[im 1/45]
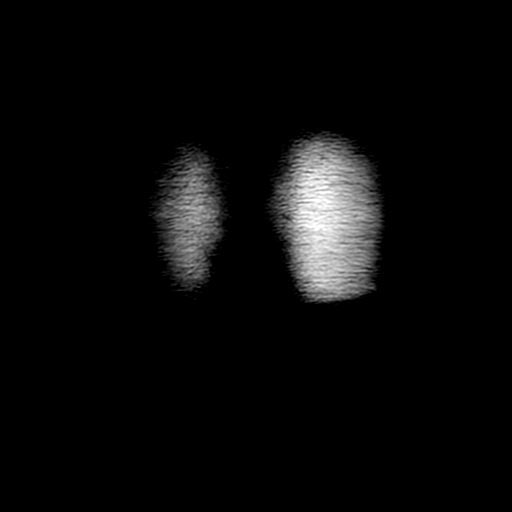

[Series 3: ax ssfse nav · axial · 6.0mm · 0.78mm/px · 1 of 49 slices shown]
[im 1/49]
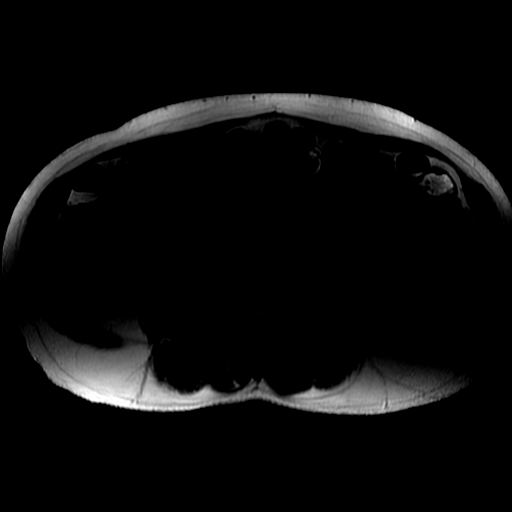

[Series 4: T2 fat-sat · axial · 6.0mm · 0.74mm/px · 1 of 44 slices shown]
[im 1/44]
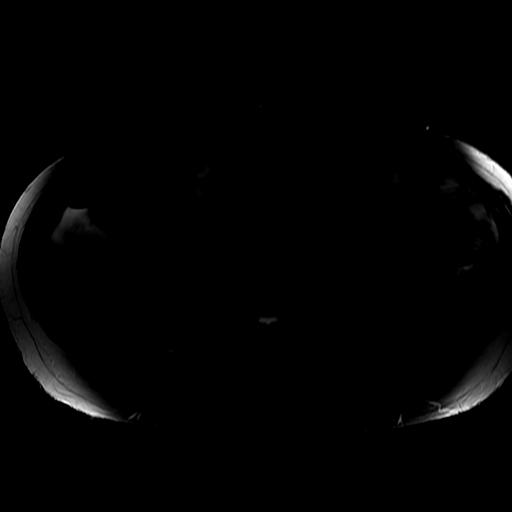

[Series 5: T1 dynamic · axial · 5.0mm · 0.82mm/px · 1 of 112 slices shown (1 of 5)]
[im 1/112]
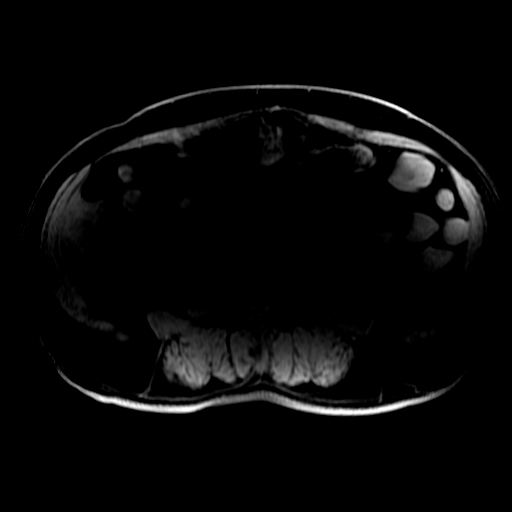

[Series 10: radial 2d thick · sagittal · 40.0mm · 0.86mm/px · 1 of 6 slices shown]
[im 1/6]
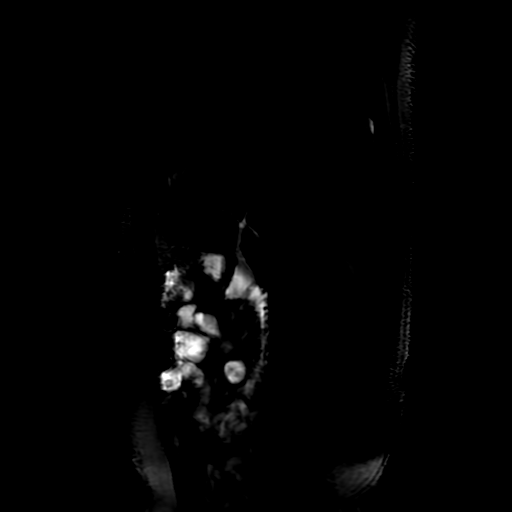

[Series 11: DWI b500 · axial · 8.0mm · 1.48mm/px · 1 of 64 slices shown]
[im 1/64]
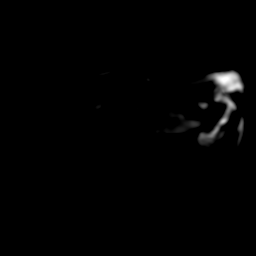

[Series 12: bSSFP · coronal · 6.0mm · 0.78mm/px · 1 of 45 slices shown]
[im 1/45]
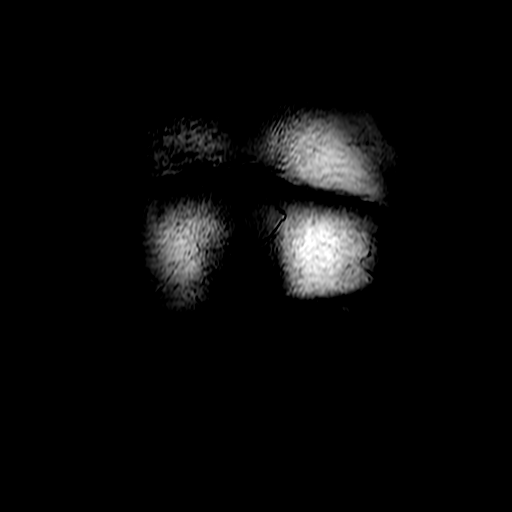

[Series 14: T1 dynamic · coronal · 3.3mm · 1.56mm/px · 3 of 152 slices shown (2 of 5)]
[im 1/152]
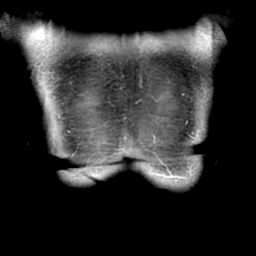
[im 76/152]
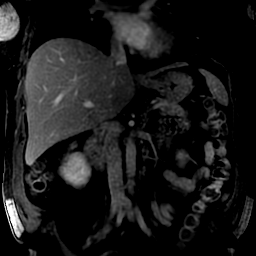
[im 152/152]
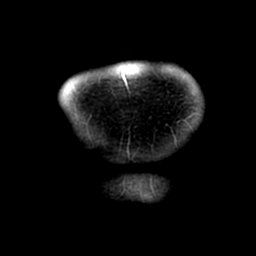

[Series 500: T1 dynamic · axial · 5.0mm · 0.82mm/px · z∈[-91,+186]mm · 2 of 112 slices shown (3 of 5)]
[im 1/112]
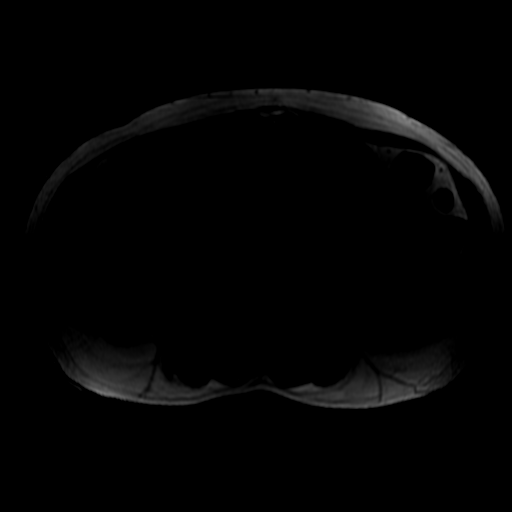
[im 112/112]
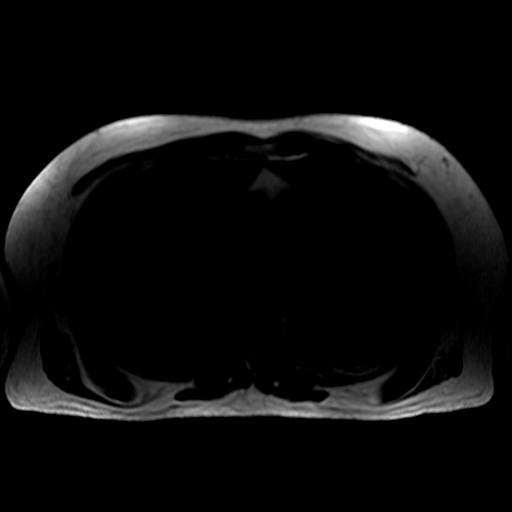

[Series 501: T1 dynamic · axial · 5.0mm · 0.82mm/px · z∈[-91,+186]mm · 2 of 112 slices shown (4 of 5)]
[im 1/112]
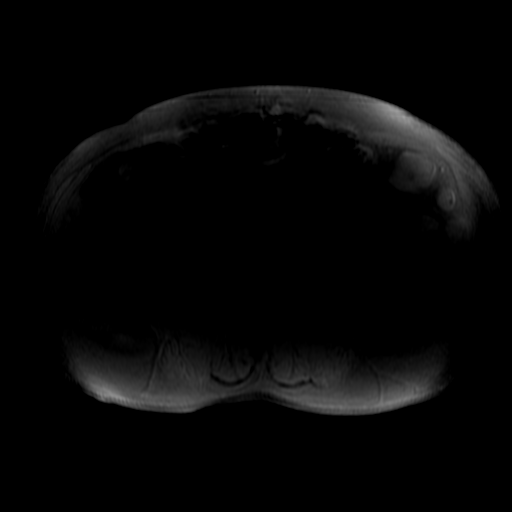
[im 112/112]
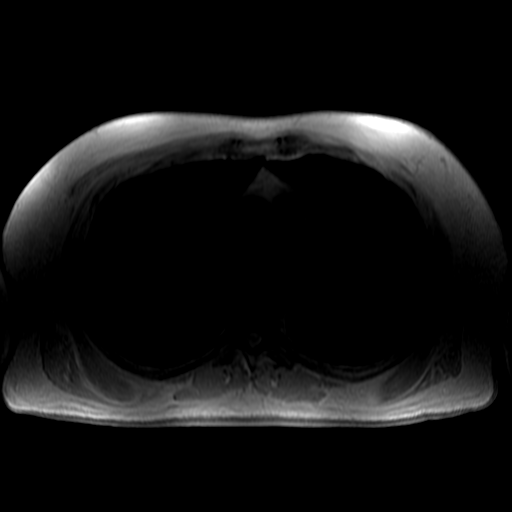

[Series 502: T1 dynamic · axial · 5.0mm · 0.82mm/px · z∈[-91,+186]mm · 2 of 112 slices shown (5 of 5)]
[im 1/112]
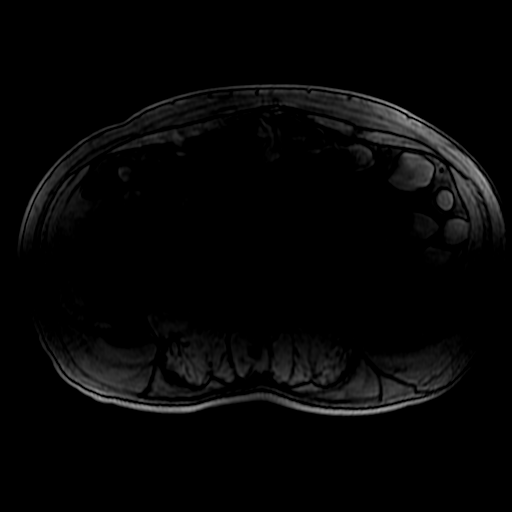
[im 112/112]
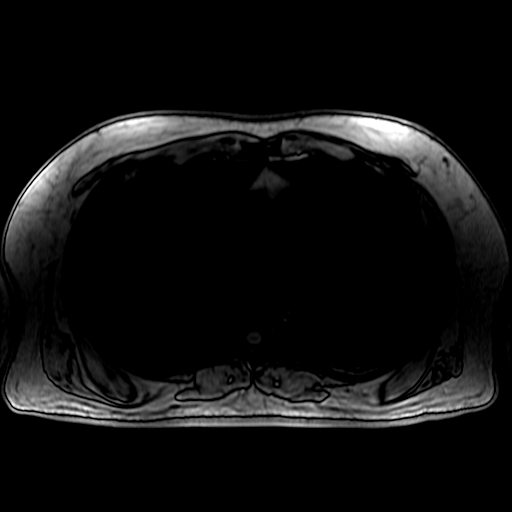

[Series 650: ADC · axial · 8.0mm · 1.48mm/px · 1 of 32 slices shown (1 of 2)]
[im 1/32]
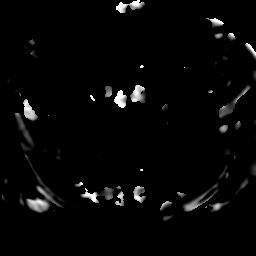

[Series 1150: ADC · axial · 8.0mm · 1.48mm/px · 1 of 24 slices shown (2 of 2)]
[im 1/24]
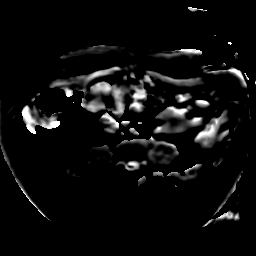

[Series 1300: T1 dynamic post-contrast · axial · non-contrast · 3.9mm · 0.86mm/px · z∈[-85,+185]mm · 3 of 136 slices shown (1 of 2)]
[im 1/136]
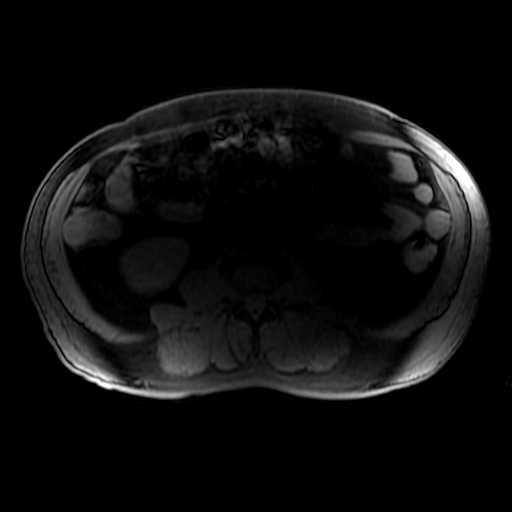
[im 68/136]
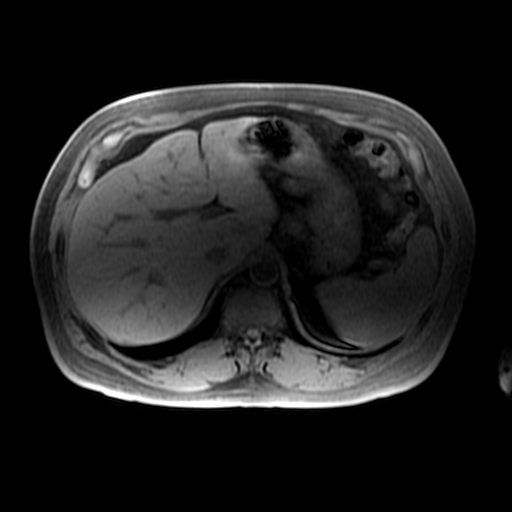
[im 136/136]
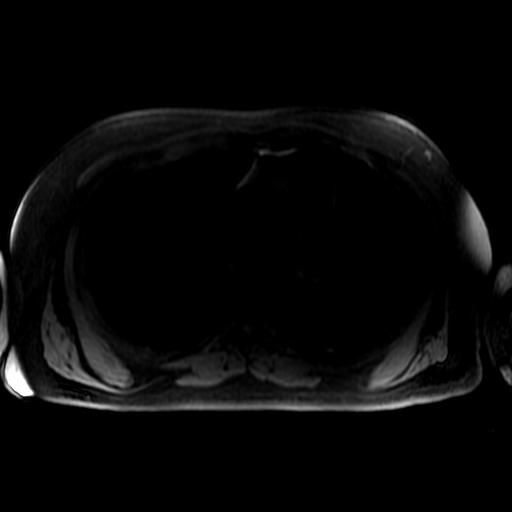

[Series 1301: T1 dynamic post-contrast · axial · non-contrast · 3.9mm · 0.86mm/px · z∈[-85,+49]mm · 2 of 136 slices shown (2 of 2)]
[im 1/136]
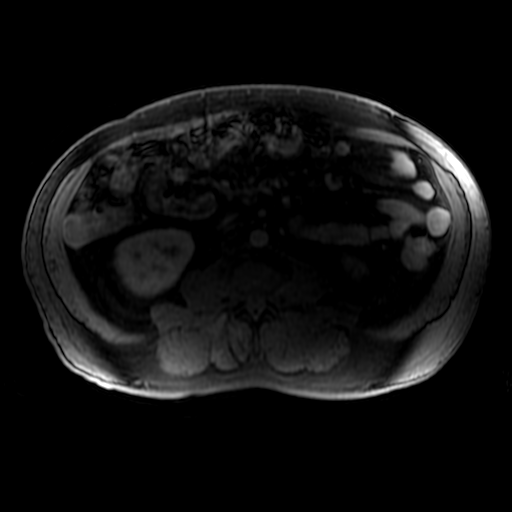
[im 68/136]
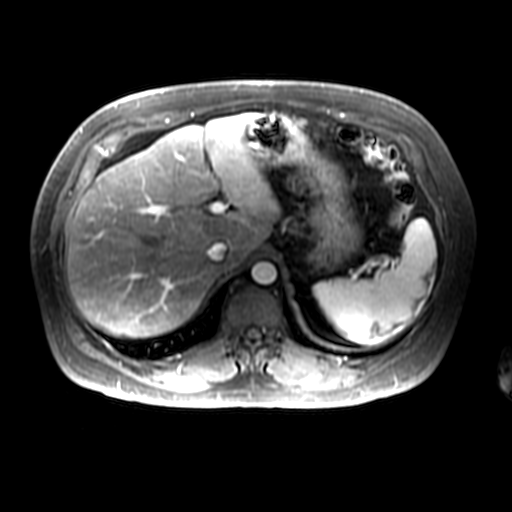

[23 of 48 positions shown; findings below may reference images not displayed]

FINDINGS: Lower chest: Is incidental imaging of the lung bases without sign of
effusion or evidence of consolidative changes. Limited assessment on
MRI.

Hepatobiliary: Post cholecystectomy. No sign of biliary duct
dilation or discrete filling defect. In the common bile duct. No
focal, suspicious hepatic lesion. The portal vein is patent. Hepatic
veins are patent.

Pancreas: Pancreas with preserved intrinsic T1 signal in the
pancreatic head. No sign of abnormal enhancement., enhancement is
preserved. No visible lesion in the area of the pancreatic head. No
pancreatic ductal dilation. No substantial peripancreatic edema.
Question mild edema about the pancreatic head. No adjacent fluid.

Spleen:  Normal spleen.

Adrenals/Urinary Tract: Adrenal glands are normal. Symmetric renal
enhancement. No hydronephrosis.

Stomach/Bowel: Visualized gastrointestinal tract without acute
process to the extent evaluated on abdominal MRI.

Vascular/Lymphatic: No pathologically enlarged lymph nodes
identified. No abdominal aortic aneurysm demonstrated.

Other:  None.

Musculoskeletal: No suspicious bone lesions identified.
IMPRESSION: 1. Mild fullness of the pancreatic head with questionable mild edema
in the area of the pancreatic head. No sign of pancreatic mass or
ductal dilation. No peripancreatic fluid collection. Findings may
reflect mild open "groove" pancreatitis or could represent normal
anatomic variant.
2. Post cholecystectomy. No sign of biliary duct dilation or
discrete filling defect.

## 2020-07-20 MED ORDER — GADOBUTROL 1 MMOL/ML IV SOLN
10.0000 mL | Freq: Once | INTRAVENOUS | Status: AC | PRN
  Administered 2020-07-20: 10 mL via INTRAVENOUS

## 2020-07-20 MED ORDER — FENTANYL CITRATE (PF) 100 MCG/2ML IJ SOLN
25.0000 ug | Freq: Once | INTRAMUSCULAR | Status: AC
  Administered 2020-07-20: 25 ug via INTRAVENOUS
  Filled 2020-07-20: qty 2

## 2020-07-20 MED ORDER — INSULIN DETEMIR 100 UNIT/ML ~~LOC~~ SOLN
25.0000 [IU] | Freq: Every day | SUBCUTANEOUS | Status: DC
  Administered 2020-07-21 – 2020-07-25 (×5): 25 [IU] via SUBCUTANEOUS
  Filled 2020-07-20 (×5): qty 0.25

## 2020-07-20 MED ORDER — ESCITALOPRAM OXALATE 10 MG PO TABS
20.0000 mg | ORAL_TABLET | Freq: Every day | ORAL | Status: DC
  Administered 2020-07-21 – 2020-07-25 (×5): 20 mg via ORAL
  Filled 2020-07-20 (×5): qty 2

## 2020-07-20 MED ORDER — ENOXAPARIN SODIUM 40 MG/0.4ML IJ SOSY
40.0000 mg | PREFILLED_SYRINGE | INTRAMUSCULAR | Status: DC
  Administered 2020-07-21 – 2020-07-24 (×5): 40 mg via SUBCUTANEOUS
  Filled 2020-07-20 (×5): qty 0.4

## 2020-07-20 MED ORDER — ACETAMINOPHEN 325 MG PO TABS: 650.0000 mg | ORAL_TABLET | Freq: Four times a day (QID) | ORAL | Status: DC | PRN

## 2020-07-20 MED ORDER — LACTATED RINGERS IV BOLUS (SEPSIS)
1000.0000 mL | Freq: Once | INTRAVENOUS | Status: AC
  Administered 2020-07-20: 1000 mL via INTRAVENOUS

## 2020-07-20 MED ORDER — ICOSAPENT ETHYL 1 G PO CAPS
2.0000 g | ORAL_CAPSULE | Freq: Two times a day (BID) | ORAL | Status: DC
  Administered 2020-07-21 – 2020-07-25 (×9): 2 g via ORAL
  Filled 2020-07-20 (×11): qty 2

## 2020-07-20 MED ORDER — INSULIN ASPART 100 UNIT/ML IJ SOLN
0.0000 [IU] | INTRAMUSCULAR | Status: DC
  Administered 2020-07-23: 2 [IU] via SUBCUTANEOUS
  Administered 2020-07-23 – 2020-07-25 (×6): 1 [IU] via SUBCUTANEOUS

## 2020-07-20 MED ORDER — TICAGRELOR 90 MG PO TABS
90.0000 mg | ORAL_TABLET | Freq: Two times a day (BID) | ORAL | Status: DC
  Administered 2020-07-21 – 2020-07-25 (×10): 90 mg via ORAL
  Filled 2020-07-20 (×12): qty 1

## 2020-07-20 MED ORDER — FENOFIBRATE 160 MG PO TABS
160.0000 mg | ORAL_TABLET | Freq: Every day | ORAL | Status: DC
  Administered 2020-07-21 – 2020-07-25 (×5): 160 mg via ORAL
  Filled 2020-07-20 (×5): qty 1

## 2020-07-20 MED ORDER — ONDANSETRON HCL 4 MG PO TABS: 4.0000 mg | ORAL_TABLET | Freq: Four times a day (QID) | ORAL | Status: DC | PRN

## 2020-07-20 MED ORDER — ONDANSETRON HCL 4 MG/2ML IJ SOLN: 4.0000 mg | Freq: Four times a day (QID) | INTRAMUSCULAR | Status: DC | PRN

## 2020-07-20 MED ORDER — ONDANSETRON HCL 4 MG/2ML IJ SOLN
4.0000 mg | Freq: Once | INTRAMUSCULAR | Status: AC
  Administered 2020-07-20: 4 mg via INTRAVENOUS
  Filled 2020-07-20: qty 2

## 2020-07-20 MED ORDER — SODIUM CHLORIDE 0.9 % IV SOLN: 2.0000 g | Freq: Three times a day (TID) | INTRAVENOUS | Status: DC

## 2020-07-20 MED ORDER — LACTATED RINGERS IV SOLN: INTRAVENOUS | Status: DC

## 2020-07-20 MED ORDER — ACETAMINOPHEN 650 MG RE SUPP: 650.0000 mg | Freq: Four times a day (QID) | RECTAL | Status: DC | PRN

## 2020-07-20 MED ORDER — METRONIDAZOLE 500 MG/100ML IV SOLN
500.0000 mg | Freq: Once | INTRAVENOUS | Status: AC
  Administered 2020-07-20: 500 mg via INTRAVENOUS
  Filled 2020-07-20: qty 100

## 2020-07-20 MED ORDER — ROSUVASTATIN CALCIUM 20 MG PO TABS
40.0000 mg | ORAL_TABLET | Freq: Every day | ORAL | Status: DC
  Administered 2020-07-21 – 2020-07-25 (×5): 40 mg via ORAL
  Filled 2020-07-20 (×5): qty 2

## 2020-07-20 MED ORDER — ASPIRIN EC 81 MG PO TBEC
81.0000 mg | DELAYED_RELEASE_TABLET | Freq: Every day | ORAL | Status: DC
  Administered 2020-07-21 – 2020-07-25 (×5): 81 mg via ORAL
  Filled 2020-07-20 (×5): qty 1

## 2020-07-20 MED ORDER — SODIUM CHLORIDE 0.9 % IV SOLN
2.0000 g | Freq: Once | INTRAVENOUS | Status: AC
  Administered 2020-07-20: 2 g via INTRAVENOUS
  Filled 2020-07-20: qty 2

## 2020-07-20 MED ORDER — PANTOPRAZOLE SODIUM 40 MG PO TBEC
40.0000 mg | DELAYED_RELEASE_TABLET | Freq: Every day | ORAL | Status: DC
  Administered 2020-07-21 – 2020-07-25 (×5): 40 mg via ORAL
  Filled 2020-07-20 (×5): qty 1

## 2020-07-20 MED ORDER — FENTANYL CITRATE (PF) 100 MCG/2ML IJ SOLN
25.0000 ug | INTRAMUSCULAR | Status: DC | PRN
  Administered 2020-07-20 – 2020-07-21 (×5): 50 ug via INTRAVENOUS
  Filled 2020-07-20 (×5): qty 2

## 2020-07-20 NOTE — ED Notes (Signed)
ED TO INPATIENT HANDOFF REPORT  ED Nurse Name and Phone #:   S Name/Age/Gender Dan Ford 50 y.o. male Room/Bed: 026C/026C  Code Status   Code Status: Full Code  Home/SNF/Other Home Patient oriented to: self, place, time and situation Is this baseline? Yes   Triage Complete: Triage complete  Chief Complaint Acute pancreatitis [K85.90]  Triage Note Pt came in d/t  SOB ,Tachycardia and hypotension. Pt states has been feeling nauseous throughout the day,and vomitted 3 times today. Pt had a stent placement back in march this year. A&O x 4 .     Allergies Allergies  Allergen Reactions  . 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 Amoxicillin Nausea Only  . Iodine-131 Other (See Comments)    Kidney problems    Level of Care/Admitting Diagnosis ED Disposition    ED Disposition Condition Comment   Admit  Hospital Area: [facility] [[id number]]  Level of Care: Progressive [102]  Admit to Progressive based on following criteria: CARDIOVASCULAR & THORACIC of moderate stability with acute coronary syndrome symptoms/low risk myocardial infarction/hypertensive urgency/arrhythmias/heart failure potentially compromising stability and stable post cardiovascular intervention patients.  May admit patient to [facility] or [facility] if equivalent level of care is available:: Yes  Covid Evaluation: Confirmed COVID Negative  Diagnosis: Acute pancreatitis [577.0.ICD-9-CM]  Admitting Physician: Briscoe Deutscher [[id number]]  Attending Physician: Briscoe Deutscher [[id number]]  Estimated length of stay: past midnight tomorrow  Certification:: I certify this patient will need inpatient services for at least 2 midnights       B Medical/Surgery History Past  Medical History:  Diagnosis Date  . Coronary artery disease   . Diabetes mellitus without complication (HCC)   . Family history of adverse reaction to anesthesia   . Hyperlipidemia   . Hypertension    Past Surgical History:  Procedure Laterality Date  . APPENDECTOMY    . ARTHOSCOPIC ROTAOR CUFF REPAIR    . CHOLECYSTECTOMY    . CORONARY/GRAFT ACUTE MI REVASCULARIZATION N/A 05/01/2020   Procedure: Coronary/Graft Acute MI Revascularization;  Surgeon: Corky Crafts, MD;  Location: [facility] INVASIVE CV LAB;  Service: Cardiovascular;  Laterality: N/A;  . LEFT HEART CATH AND CORONARY ANGIOGRAPHY N/A 05/01/2020   Procedure: LEFT HEART CATH AND CORONARY ANGIOGRAPHY;  Surgeon: Corky Crafts, MD;  Location: [facility] INVASIVE CV LAB;  Service: Cardiovascular;  Laterality: N/A;     A IV Location/Drains/Wounds Patient Lines/Drains/Airways Status    Active Line/Drains/Airways    Name Placement date Placement time Site Days   Peripheral IV 07/20/20 Right Antecubital 07/20/20  1931  Antecubital  less than 1   Peripheral IV 07/20/20 Left Antecubital 07/20/20  1929  Antecubital  less than 1          Intake/Output Last 24 hours  Intake/Output Summary (Last 24 hours) at 07/20/2020 2215 Last data filed at 07/20/2020 2127 Gross per 24 hour  Intake 3200.21 ml  Output --  Net 3200.21 ml    Labs/Imaging Results for orders placed or performed during the hospital encounter of 07/20/20 (from the past 48 hour(s))  CBC with Differential     Status: Abnormal   Collection Time: 07/20/20  6:07 PM  Result Value Ref Range   WBC 11.0 (H) 4.0 - 10.5 K/uL   RBC 5.82 (H) 4.22 - 5.81 MIL/uL  Hemoglobin 16.9 13.0 - 17.0 g/dL   HCT 16.1 09.6 - 04.5 %   MCV 85.1 80.0 - 100.0 fL   MCH 29.0 26.0 - 34.0 pg   MCHC 34.1 30.0 - 36.0 g/dL   RDW 40.9 81.1 - 91.4 %   Platelets 389 150 - 400 K/uL   nRBC 0.0 0.0 - 0.2 %   Neutrophils Relative % 75 %   Neutro Abs 8.3 (H) 1.7 - 7.7 K/uL   Lymphocytes Relative 16 %    Lymphs Abs 1.7 0.7 - 4.0 K/uL   Monocytes Relative 6 %   Monocytes Absolute 0.6 0.1 - 1.0 K/uL   Eosinophils Relative 3 %   Eosinophils Absolute 0.3 0.0 - 0.5 K/uL   Basophils Relative 0 %   Basophils Absolute 0.0 0.0 - 0.1 K/uL   Immature Granulocytes 0 %   Abs Immature Granulocytes 0.03 0.00 - 0.07 K/uL    Comment: Performed at [facility], [address], [place], [place] [address]  Comprehensive metabolic panel     Status: Abnormal   Collection Time: 07/20/20  6:07 PM  Result Value Ref Range   Sodium 129 (L) 135 - 145 mmol/L   Potassium 4.1 3.5 - 5.1 mmol/L   Chloride 97 (L) 98 - 111 mmol/L   CO2 19 (L) 22 - 32 mmol/L   Glucose, Bld 223 (H) 70 - 99 mg/dL    Comment: Glucose reference range applies only to samples taken after fasting for at least 8 hours.   BUN 27 (H) 6 - 20 mg/dL   Creatinine, Ser 6.21 0.61 - 1.24 mg/dL   Calcium 9.3 8.9 - 30.8 mg/dL   Total Protein 7.6 6.5 - 8.1 g/dL   Albumin 3.9 3.5 - 5.0 g/dL   AST 24 15 - 41 U/L   ALT 29 0 - 44 U/L   Alkaline Phosphatase 59 38 - 126 U/L   Total Bilirubin 1.0 0.3 - 1.2 mg/dL   GFR, Estimated >65 >78 mL/min    Comment: (NOTE) Calculated using the CKD-EPI Creatinine Equation (2021)    Anion gap 13 5 - 15    Comment: Performed at [facility], [address], [place], [place] [address]  Lipase, blood     Status: None   Collection Time: 07/20/20  6:07 PM  Result Value Ref Range   Lipase 30 11 - 51 U/L    Comment: Performed at [facility], [address], [place], [place] [address]  Resp Panel by RT-PCR (Flu A&B, Covid) Nasopharyngeal Swab     Status: None   Collection Time: 07/20/20  7:40 PM   Specimen: Nasopharyngeal Swab; Nasopharyngeal(NP) swabs in vial transport medium  Result Value Ref Range   SARS Coronavirus 2 by RT PCR NEGATIVE NEGATIVE    Comment: (NOTE) SARS-CoV-2 target nucleic acids are NOT DETECTED.  The SARS-CoV-2 RNA is generally detectable in upper respiratory specimens during  the acute phase of infection. The lowest concentration of SARS-CoV-2 viral copies this assay can detect is 138 copies/mL. A negative result does not preclude SARS-Cov-2 infection and should not be used as the sole basis for treatment or other patient management decisions. A negative result may occur with  improper specimen collection/handling, submission of specimen other than nasopharyngeal swab, presence of viral mutation(s) within the areas targeted by this assay, and inadequate number of viral copies(<138 copies/mL). A negative result must be combined with clinical observations, patient history, and epidemiological information. The expected result is  Negative.  Fact Sheet for Patients:  BloggerCourse.comhttps://www.fda.gov/media/152166/download  Fact Sheet for Healthcare Providers:  SeriousBroker.ithttps://www.fda.gov/media/152162/download  This test is no t yet approved or cleared by the [place] FDA and  has been authorized for detection and/or diagnosis of SARS-CoV-2 by FDA under an Emergency Use Authorization (EUA). This EUA will remain  in effect (meaning this test can be used) for the duration of the COVID-19 declaration under Section 564(b)(1) of the Act, 21 U.S.C.section 360bbb-3(b)(1), unless the authorization is terminated  or revoked sooner.       Influenza A by PCR NEGATIVE NEGATIVE   Influenza B by PCR NEGATIVE NEGATIVE    Comment: (NOTE) The Xpert Xpress SARS-CoV-2/FLU/RSV plus assay is intended as an aid in the diagnosis of influenza from Nasopharyngeal swab specimens and should not be used as a sole basis for treatment. Nasal washings and aspirates are unacceptable for Xpert Xpress SARS-CoV-2/FLU/RSV testing.  Fact Sheet for Patients: BloggerCourse.comhttps://www.fda.gov/media/152166/download  Fact Sheet for Healthcare Providers: SeriousBroker.ithttps://www.fda.gov/media/152162/download  This test is not yet approved or cleared by the [place] FDA and has been authorized for detection and/or diagnosis of  SARS-CoV-2 by FDA under an Emergency Use Authorization (EUA). This EUA will remain in effect (meaning this test can be used) for the duration of the COVID-19 declaration under Section 564(b)(1) of the Act, 21 U.S.C. section 360bbb-3(b)(1), unless the authorization is terminated or revoked.  Performed at [facility], [address] [place], [place] [address]   Lactic acid, plasma     Status: None   Collection Time: 07/20/20  7:40 PM  Result Value Ref Range   Lactic Acid, Venous 1.4 0.5 - 1.9 mmol/L    Comment: Performed at [facility], [address] [place], [place] [address]  Protime-INR     Status: None   Collection Time: 07/20/20  7:40 PM  Result Value Ref Range   Prothrombin Time 14.4 11.4 - 15.2 seconds   INR 1.1 0.8 - 1.2    Comment: (NOTE) INR goal varies based on device and disease states. Performed at [facility], [address] [place], [place] [address]   APTT     Status: None   Collection Time: 07/20/20  7:40 PM  Result Value Ref Range   aPTT 28 24 - 36 seconds    Comment: Performed at [facility], [address] [place], [place] [address]   CT ABDOMEN PELVIS WO CONTRAST  Result Date: 07/20/2020 CLINICAL DATA:  Acute generalized abdominal pain. EXAM: CT ABDOMEN AND PELVIS WITHOUT CONTRAST TECHNIQUE: Multidetector CT imaging of the abdomen and pelvis was performed following the standard protocol without IV contrast. COMPARISON:  None. FINDINGS: Lower chest: No acute abnormality. Hepatobiliary: No focal liver abnormality is seen. Status post cholecystectomy. No biliary dilatation. Pancreas: Prominence of the pancreatic head is noted without ductal dilatation. Is uncertain if this represents possible acute pancreatitis or less likely mass. Spleen: Normal in size without focal abnormality. Adrenals/Urinary Tract: Adrenal glands are unremarkable. Kidneys are normal, without renal calculi, focal lesion, or hydronephrosis. Bladder is unremarkable.  Stomach/Bowel: The stomach appears normal. There is no evidence of bowel obstruction or inflammation. Status post appendectomy. Vascular/Lymphatic: Aortic atherosclerosis. No enlarged abdominal or pelvic lymph nodes. Reproductive: Prostate is unremarkable. Other: No abdominal wall hernia or abnormality. No abdominopelvic ascites. Musculoskeletal: No acute or significant osseous findings. IMPRESSION: Prominence of the pancreatic head is noted on this unenhanced scan. It is uncertain if this represents acute pancreatitis or potentially pancreatic mass.  Further evaluation with MRI or CT scan with intravenous contrast is recommended. Aortic Atherosclerosis (ICD10-I70.0). Electronically Signed   By: Lupita Raider M.D.   On: 07/20/2020 19:22    Pending Labs Unresulted Labs (From admission, onward)          Start     Ordered   07/27/20 0500  Creatinine, serum  (enoxaparin (LOVENOX)    CrCl >/= 30 ml/min)  Weekly,   R     Comments: while on enoxaparin therapy    07/20/20 2151   07/21/20 0500  Comprehensive metabolic panel  Daily,   R      07/20/20 2151   07/21/20 0500  CBC  Daily,   R      07/20/20 2151   07/20/20 2154  Amylase  Once,   STAT        07/20/20 2153   07/20/20 2151  Urine culture  Once,   STAT        07/20/20 2151   07/20/20 2132  Triglycerides  ONCE - STAT,   STAT        07/20/20 2131   07/20/20 1856  Lactic acid, plasma  (Septic presentation on arrival (screening labs, nursing and treatment orders for obvious sepsis))  Now then every 2 hours,   STAT      07/20/20 1857   07/20/20 1856  Blood Culture (routine x 2)  (Septic presentation on arrival (screening labs, nursing and treatment orders for obvious sepsis))  BLOOD CULTURE X 2,   STAT      07/20/20 1857   07/20/20 1856  Urine culture  (Septic presentation on arrival (screening labs, nursing and treatment orders for obvious sepsis))  ONCE - STAT,   STAT        07/20/20 1857   07/20/20 1855  Gastrointestinal Panel by PCR , Stool   (Gastrointestinal Panel by PCR, Stool                                                                                                                                                     **Does Not include CLOSTRIDIUM DIFFICILE testing. **If CDIFF testing is needed, place order from the "C Difficile Testing" order set.**)  Once,   STAT        07/20/20 1857   07/20/20 1855  C Difficile Quick Screen w PCR reflex  (C Difficile quick screen w PCR reflex panel)  Once, for 24 hours,   STAT       References:    CDiff Information Tool   07/20/20 1857   07/20/20 1807  Urinalysis, Routine w reflex microscopic  (ED Abdominal Pain)  ONCE - STAT,   STAT        07/20/20 1806          Vitals/Pain Today's Vitals   07/20/20 2000 07/20/20 2015  07/20/20 2020 07/20/20 2030  BP: 108/71 113/71 108/61 (!) 104/57  Pulse: (!) 103 (!) 104 (!) 105 (!) 108  Resp: 19 17 18 18   Temp:      SpO2: 100% 99% 99% 98%  Weight:      Height:      PainSc:        Isolation Precautions Enteric precautions (UV disinfection)  Medications Medications  aspirin EC tablet 81 mg (has no administration in time range)  rosuvastatin (CRESTOR) tablet 40 mg (has no administration in time range)  pantoprazole (PROTONIX) EC tablet 40 mg (has no administration in time range)  ticagrelor (BRILINTA) tablet 90 mg (has no administration in time range)  escitalopram (LEXAPRO) tablet 20 mg (has no administration in time range)  icosapent Ethyl (VASCEPA) 1 g capsule 2 g (has no administration in time range)  fenofibrate tablet 160 mg (has no administration in time range)  insulin aspart (novoLOG) injection 0-9 Units (has no administration in time range)  insulin detemir (LEVEMIR) injection 25 Units (has no administration in time range)  enoxaparin (LOVENOX) injection 40 mg (has no administration in time range)  lactated ringers infusion (has no administration in time range)  acetaminophen (TYLENOL) tablet 650 mg (has no administration in  time range)    Or  acetaminophen (TYLENOL) suppository 650 mg (has no administration in time range)  fentaNYL (SUBLIMAZE) injection 25-50 mcg (has no administration in time range)  ondansetron (ZOFRAN) tablet 4 mg (has no administration in time range)    Or  ondansetron (ZOFRAN) injection 4 mg (has no administration in time range)  lactated ringers bolus 1,000 mL (0 mLs Intravenous Stopped 07/20/20 2126)    And  lactated ringers bolus 1,000 mL (0 mLs Intravenous Stopped 07/20/20 2126)    And  lactated ringers bolus 1,000 mL (0 mLs Intravenous Stopped 07/20/20 2127)  ceFEPIme (MAXIPIME) 2 g in sodium chloride 0.9 % 100 mL IVPB (0 g Intravenous Stopped 07/20/20 2014)  metroNIDAZOLE (FLAGYL) IVPB 500 mg (0 mg Intravenous Stopped 07/20/20 2127)  ondansetron (ZOFRAN) injection 4 mg (4 mg Intravenous Given 07/20/20 2129)  fentaNYL (SUBLIMAZE) injection 25 mcg (25 mcg Intravenous Given 07/20/20 2129)    Mobility walks     Focused Assessments    R Recommendations: See Admitting Provider Note  Report given to:   Additional Notes:

## 2020-07-20 NOTE — ED Provider Notes (Signed)
Emergency Medicine Provider Triage Evaluation Note  Dan Ford , a 50 y.o. male  was evaluated in triage.  Pt complains of generalized abdominal pain with nausea, vomiting, diarrhea onset last night, feeling generally weak and dizzy today, diaphoretic at times.  Denies pain in the chest or shortness of breath.  Denies blood in emesis or stools.  Sent by urgent care when he was found to be tachycardic and hypotensive.  Review of Systems  Positive: Abdominal pain, nausea, vomiting, diarrhea Negative: Hematemesis, melena  Physical Exam  There were no vitals taken for this visit. Gen:   Awake, no distress, pale, appears to feel unwell Resp:  Normal effort  MSK:   Moves extremities without difficulty  Other:    Medical Decision Making  Medically screening exam initiated at 5:54 PM.  Appropriate orders placed.  Dan Ford was informed that the remainder of the evaluation will be completed by another provider, this initial triage assessment does not replace that evaluation, and the importance of remaining in the ED until their evaluation is complete.     Jeannie Fend, PA-C 07/20/20 1806    Benjiman Core, MD 07/20/20 [phone]

## 2020-07-20 NOTE — Progress Notes (Signed)
Pharmacy Antibiotic Note  Dan Ford is a 50 y.o. male admitted on 07/20/2020 presenting with SOB, tachy and hypotensive, concern for IAI.  Pharmacy has been consulted for cefepime dosing.  Flagyl per MD  Plan: Cefepime 2g IV every 8 hours Monitor renal function, ID workup and ability to narrow  Height: 5\' 11"  (180.3 cm) Weight: 92.5 kg (204 lb) IBW/kg (Calculated) : 75.3  Temp (24hrs), Avg:97.7 F (36.5 C), Min:97.7 F (36.5 C), Max:97.7 F (36.5 C)  No results for input(s): WBC, CREATININE, LATICACIDVEN, VANCOTROUGH, VANCOPEAK, VANCORANDOM, GENTTROUGH, GENTPEAK, GENTRANDOM, TOBRATROUGH, TOBRAPEAK, TOBRARND, AMIKACINPEAK, AMIKACINTROU, AMIKACIN in the last 168 hours.  CrCl cannot be calculated (Patient's most recent lab result is older than the maximum 21 days allowed.).    Allergies  Allergen Reactions  . 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 Amoxicillin Nausea Only  . Iodine-131 Other (See Comments)    Kidney problems    , PharmD Clinical Pharmacist ED Pharmacist Phone # [phone] 07/20/2020 7:00 PM

## 2020-07-20 NOTE — ED Provider Notes (Signed)
[facility]   CSN: [id number] Arrival date & time: 07/20/20  1751     History Chief Complaint  Patient presents with  . Tachycardia    Dan ChristenStephen M Sindelar is a 50 y.o. male.  HPI      50yo male with history of DM, hypertension, hyperlipidemia, STEMI 05/2020, CAD, OSA, celiac artery stenosis, recurrent pancreatitis secondary to hypertriglyceridemia with prior ICU stay and pressor support due to pancreatitis complications, who presents with concern for abdominal pain, nausea, vomiting and diarrhea sent by urgent care due to tachycardia and hypotension.  Symptoms began last night. Has had 3 episodes of emesis and 13 episodes of diarrhea. Denies black or bloody stools. No known fevers. Abdominal pain is epigastric and towards RUQ.  Has history of cholecystectomy and appendectomy.  Feels generally weak, fatigue, palpitations, lightheaded.  Denies chest pain, dyspnea, cough, urinary symptoms.  Pain is severe however not as severe as prior pancreatitis.    Past Medical History:  Diagnosis Date  . Coronary artery disease   . Diabetes mellitus without complication (HCC)   . Family history of adverse reaction to anesthesia   . Hyperlipidemia   . Hypertension     Patient Active Problem List   Diagnosis Date Noted  . Sleep apnea, obstructive   . Mixed hyperlipidemia   . STEMI (ST elevation myocardial infarction) (HCC) 05/01/2020  . Type 2 diabetes mellitus with hyperglycemia, with long-term current use of insulin (HCC) 05/08/2018  . Acute recurrent pancreatitis 09/14/2017  . Celiac artery stenosis (HCC) 05/11/2015  . Adjustment disorder with anxiety 04/17/2015  . Obesity (BMI 30.0-34.9) 04/17/2015  . Hyperlipidemia associated with type 2 diabetes mellitus (HCC) 06/12/2014  . Essential hypertension 04/21/2014    Past Surgical History:  Procedure Laterality Date  . APPENDECTOMY    . ARTHOSCOPIC ROTAOR CUFF REPAIR    .  CHOLECYSTECTOMY    . CORONARY/GRAFT ACUTE MI REVASCULARIZATION N/A 05/01/2020   Procedure: Coronary/Graft Acute MI Revascularization;  Surgeon: Corky CraftsVaranasi, Jayadeep S, MD;  [facility];  Service: Cardiovascular;  Laterality: N/A;  . LEFT HEART CATH AND CORONARY ANGIOGRAPHY N/A 05/01/2020   Procedure: LEFT HEART CATH AND CORONARY ANGIOGRAPHY;  Surgeon: Corky CraftsVaranasi, Jayadeep S, MD;  [facility];  Service: Cardiovascular;  Laterality: N/A;       Family History  Problem Relation Age of Onset  . Arrhythmia Mother   . Atrial fibrillation Mother     Social History   Tobacco Use  . Smoking status: Former Games developermoker  . Smokeless tobacco: Never Used  Vaping Use  . Vaping Use: Never used  Substance Use Topics  . Alcohol use: Never  . Drug use: Never    Home Medications Prior to Admission medications   Medication Sig Start Date End Date Taking? Authorizing Provider  aspirin EC 81 MG tablet Take 1 tablet (81 mg total) by mouth daily. Swallow whole. 05/30/20 05/30/21  Corky CraftsVaranasi, Jayadeep S, MD  dapagliflozin propanediol (FARXIGA) 10 MG TABS tablet Take 1 tablet (10 mg total) by mouth daily. 05/30/20   Corky CraftsVaranasi, Jayadeep S, MD  escitalopram (LEXAPRO) 20 MG tablet Take 20 mg by mouth daily. 05/28/16   [provider]  fenofibrate 160 MG tablet Take 1 tablet (160 mg total) by mouth daily. 05/30/20   Corky CraftsVaranasi, Jayadeep S, MD  insulin detemir (LEVEMIR) 100 UNIT/ML injection Inject 50 Units into the skin daily.    [provider]  lisinopril (ZESTRIL) 10 MG tablet Take 1  tablet (10 mg total) by mouth daily. 05/30/20   Corky Crafts, MD  metoprolol succinate (TOPROL XL) 25 MG 24 hr tablet Take 0.5 tablets (12.5 mg total) by mouth daily. 05/30/20   Corky Crafts, MD  Multiple Vitamins-Minerals (MULTIVITAMIN MEN 50+ PO) Take by mouth.    [provider]  niacin (NIASPAN) 1000 MG CR tablet Take 2 tablets (2,000 mg total) by mouth at bedtime. 06/28/20    Corky Crafts, MD  nitroGLYCERIN (NITROSTAT) 0.4 MG SL tablet Place 1 tablet (0.4 mg total) under the tongue every 5 (five) minutes x 3 doses as needed for chest pain. 05/04/20   Bhagat, Sharrell Ku, PA  pantoprazole (PROTONIX) 40 MG tablet Take 40 mg by mouth daily. 04/09/20   [provider]  rosuvastatin (CRESTOR) 40 MG tablet Take 1 tablet (40 mg total) by mouth daily. 05/30/20   Corky Crafts, MD  Semaglutide,0.25 or 0.5MG /DOS, (OZEMPIC, 0.25 OR 0.5 MG/DOSE,) 2 MG/1.5ML SOPN Inject 0.5 mg into the skin once a week.    [provider]  ticagrelor (BRILINTA) 90 MG TABS tablet Take 1 tablet (90 mg total) by mouth 2 (two) times daily. 05/30/20   Corky Crafts, MD  VASCEPA 1 g capsule Take 2 g by mouth 2 (two) times daily. 04/13/20   [provider]    Allergies    Amoxicillin-pot clavulanate, Amoxicillin, and Iodine-131  Review of Systems   Review of Systems  Constitutional: Positive for appetite change, diaphoresis and fatigue. Negative for fever.  HENT: Negative for sore throat.   Eyes: Negative for visual disturbance.  Respiratory: Negative for cough and shortness of breath.   Cardiovascular: Positive for palpitations. Negative for chest pain.  Gastrointestinal: Positive for abdominal pain, diarrhea, nausea and vomiting. Negative for blood in stool.  Genitourinary: Negative for difficulty urinating.  Musculoskeletal: Negative for back pain and neck stiffness.  Skin: Negative for rash.  Neurological: Positive for light-headedness. Negative for syncope and headaches.    Physical Exam Updated Vital Signs BP (!) 104/57   Pulse (!) 108   Temp 97.7 F (36.5 C)   Resp 18   Ht 5\' 11"  (1.803 m)   Wt 92.5 kg   SpO2 98%   BMI 28.45 kg/m   Physical Exam Vitals and nursing note reviewed.  Constitutional:      General: He is not in acute distress.    Appearance: He is well-developed. He is not diaphoretic.  HENT:     Head: Normocephalic  and atraumatic.  Eyes:     Conjunctiva/sclera: Conjunctivae normal.  Cardiovascular:     Rate and Rhythm: Regular rhythm. Tachycardia present.     Heart sounds: Normal heart sounds. No murmur heard. No friction rub. No gallop.   Pulmonary:     Effort: Pulmonary effort is normal. No respiratory distress.     Breath sounds: Normal breath sounds. No wheezing or rales.  Abdominal:     General: There is no distension.     Palpations: Abdomen is soft.     Tenderness: There is abdominal tenderness. There is no guarding.  Musculoskeletal:     Cervical back: Normal range of motion.  Skin:    General: Skin is warm and dry.  Neurological:     Mental Status: He is alert and oriented to person, place, and time.     ED Results / Procedures / Treatments   Labs (all labs ordered are listed, but only abnormal results are displayed) Labs Reviewed  CBC WITH  DIFFERENTIAL/PLATELET - Abnormal; Notable for the following components:      Result Value   WBC 11.0 (*)    RBC 5.82 (*)    Neutro Abs 8.3 (*)    All other components within normal limits  COMPREHENSIVE METABOLIC PANEL - Abnormal; Notable for the following components:   Sodium 129 (*)    Chloride 97 (*)    CO2 19 (*)    Glucose, Bld 223 (*)    BUN 27 (*)    All other components within normal limits  RESP PANEL BY RT-PCR (FLU A&B, COVID) ARPGX2  GASTROINTESTINAL PANEL BY PCR, STOOL (REPLACES STOOL CULTURE)  C DIFFICILE QUICK SCREEN W PCR REFLEX  CULTURE, BLOOD (ROUTINE X 2)  CULTURE, BLOOD (ROUTINE X 2)  URINE CULTURE  LIPASE, BLOOD  LACTIC ACID, PLASMA  PROTIME-INR  APTT  URINALYSIS, ROUTINE W REFLEX MICROSCOPIC  LACTIC ACID, PLASMA    EKG EKG Interpretation  Date/Time:  Friday Jul 20 2020 18:02:42 EDT Ventricular Rate:  128 PR Interval:  122 QRS Duration: 78 QT Interval:  334 QTC Calculation: 487 R Axis:   63 Text Interpretation: Sinus tachycardia Otherwise normal ECG Since prior ECG, rate has increased Confirmed by  Alvira Monday ([id number]) on 07/20/2020 6:29:26 PM   Radiology CT ABDOMEN PELVIS WO CONTRAST  Result Date: 07/20/2020 CLINICAL DATA:  Acute generalized abdominal pain. EXAM: CT ABDOMEN AND PELVIS WITHOUT CONTRAST TECHNIQUE: Multidetector CT imaging of the abdomen and pelvis was performed following the standard protocol without IV contrast. COMPARISON:  None. FINDINGS: Lower chest: No acute abnormality. Hepatobiliary: No focal liver abnormality is seen. Status post cholecystectomy. No biliary dilatation. Pancreas: Prominence of the pancreatic head is noted without ductal dilatation. Is uncertain if this represents possible acute pancreatitis or less likely mass. Spleen: Normal in size without focal abnormality. Adrenals/Urinary Tract: Adrenal glands are unremarkable. Kidneys are normal, without renal calculi, focal lesion, or hydronephrosis. Bladder is unremarkable. Stomach/Bowel: The stomach appears normal. There is no evidence of bowel obstruction or inflammation. Status post appendectomy. Vascular/Lymphatic: Aortic atherosclerosis. No enlarged abdominal or pelvic lymph nodes. Reproductive: Prostate is unremarkable. Other: No abdominal wall hernia or abnormality. No abdominopelvic ascites. Musculoskeletal: No acute or significant osseous findings. IMPRESSION: Prominence of the pancreatic head is noted on this unenhanced scan. It is uncertain if this represents acute pancreatitis or potentially pancreatic mass. Further evaluation with MRI or CT scan with intravenous contrast is recommended. Aortic Atherosclerosis (ICD10-I70.0). Electronically Signed   By: Lupita Raider M.D.   On: 07/20/2020 19:22    Procedures .Critical Care Performed by: Alvira Monday, MD Authorized by: Alvira Monday, MD   Critical care provider statement:    Critical care time (minutes):  45   Critical care was time spent personally by me on the following activities:  Evaluation of patient's response to treatment, examination  of patient, ordering and performing treatments and interventions, ordering and review of laboratory studies, ordering and review of radiographic studies, pulse oximetry, re-evaluation of patient's condition, obtaining history from patient or surrogate and review of old charts     Medications Ordered in ED Medications  lactated ringers infusion (has no administration in time range)  metroNIDAZOLE (FLAGYL) IVPB 500 mg (500 mg Intravenous New Bag/Given 07/20/20 2015)  ceFEPIme (MAXIPIME) 2 g in sodium chloride 0.9 % 100 mL IVPB (has no administration in time range)  ondansetron (ZOFRAN) injection 4 mg (has no administration in time range)  fentaNYL (SUBLIMAZE) injection 25 mcg (has no administration in time range)  lactated  ringers bolus 1,000 mL (1,000 mLs Intravenous New Bag/Given 07/20/20 1934)    And  lactated ringers bolus 1,000 mL (1,000 mLs Intravenous New Bag/Given 07/20/20 1932)    And  lactated ringers bolus 1,000 mL (1,000 mLs Intravenous New Bag/Given 07/20/20 2014)  ceFEPIme (MAXIPIME) 2 g in sodium chloride 0.9 % 100 mL IVPB (0 g Intravenous Stopped 07/20/20 2014)    ED Course  I have reviewed the triage vital signs and the nursing notes.  Pertinent labs & imaging results that were available during my care of the patient were reviewed by me and considered in my medical decision making (see chart for details).    MDM Rules/Calculators/A&P                          50yo male with history of DM, hypertension, hyperlipidemia, STEMI 05/2020, CAD, OSA, celiac artery stenosis, recurrent pancreatitis secondary to hypertriglyceridemia with prior ICU stay and pressor support due to pancreatitis complications, prior C Diff infection, who presents with concern for abdominal pain, nausea, vomiting and diarrhea sent by urgent care due to tachycardia and hypotension.  BP down to 80s systolic, HR 120s.  Consider vital sign abnormalities due to dehydration, SIRS from pancreatitis, sepsis.   Ordered  30cc/kg LR, empiric abx for possible intraabdominal source of infection.  Consider viral gastroenteritis,C Diff, pancreatitis, choledocolithiasis, cholangitis.  Labs show hyponatremia, no transamnitis, lipase normal.  CT abdomen shows abnormality pancreatitc head, pancreatitis vs other/mass.    BP improved with fluid, persistent mild tachycardia.  Suspect likely pancreatitis given history, history of same including prior pancreatitis with negative lipase. Ordered MRI for further evaluation, will plan on admitting for dehydration vs pancreatitis and r/o sepsis.    Final Clinical Impression(s) / ED Diagnoses Final diagnoses:  None    Rx / DC Orders ED Discharge Orders    None       Alvira Monday, MD 07/21/20 [phone]

## 2020-07-20 NOTE — ED Triage Notes (Signed)
Pt came in d/t  SOB ,Tachycardia and hypotension. Pt states has been feeling nauseous throughout the day,and vomitted 3 times today. Pt had a stent placement back in march this year. A&O x 4 .

## 2020-07-20 NOTE — H&P (Signed)
History and Physical    Dan Ford TKZ:[id number] DOB: [birth year] DOA: 07/20/2020  PCP: Dan Cleverly, PA   Patient coming from: Home   Chief Complaint: Abdominal pain, N/V/D   HPI: Dan Ford is a 50 y.o. male with medical history significant for hypertriglyceridemia with multiple bouts of pancreatitis, STEMI on 05/01/2020 status post PCI with DES, insulin-dependent diabetes mellitus, and hypertension, now presenting to the emergency department for evaluation of abdominal pain, nausea, vomiting, and diarrhea.  Patient has been started on multiple new medications since his MI, including Ozempic, niacin, fenofibrate, aspirin, and Brilinta, had been doing fairly well until developing acute upper abdominal pain last night with nausea, vomiting, and multiple episodes of diarrhea.  Abdominal pain is radiating through to his back very similar to his bouts of pancreatitis.  His wife has just recovered from a GI illness and reports that they were both at a funeral a few days ago where they later found out that another attendee had an acute GI illness.  He notes that he did also have diarrhea with prior pancreatitis admissions. Patient was initially seen at urgent care where he was noted to be hypotensive and tachycardic, and he was directed to the ED.  Patient reports that his lipase and amylase were normal during recent admission for pancreatitis.   ED Course: Upon arrival to the ED, patient is found to be afebrile, saturating well on room air, tachycardic to 120, and with systolic blood pressure in the upper 80s to 90s.  EKG features sinus tachycardia with rate 128.  CT the abdomen and pelvis without contrast is notable for prominent pancreatic head.  Chemistry panel features a glucose of 223 and sodium 129.  WBC is 11,000.  Lactic acid is normal.  Lipase is normal.  COVID and influenza PCR negative. Blood cultures were collected and patient was given 3 liters LR, cefepime, Flagyl, Zofran, and  fentanyl.   Review of Systems:  All other systems reviewed and apart from HPI, are negative.  Past Medical History:  Diagnosis Date  . Coronary artery disease   . Diabetes mellitus without complication (HCC)   . Family history of adverse reaction to anesthesia   . Hyperlipidemia   . Hypertension     Past Surgical History:  Procedure Laterality Date  . APPENDECTOMY    . ARTHOSCOPIC ROTAOR CUFF REPAIR    . CHOLECYSTECTOMY    . CORONARY/GRAFT ACUTE MI REVASCULARIZATION N/A 05/01/2020   Procedure: Coronary/Graft Acute MI Revascularization;  Surgeon: Corky Crafts, MD;  Location: [facility] INVASIVE CV LAB;  Service: Cardiovascular;  Laterality: N/A;  . LEFT HEART CATH AND CORONARY ANGIOGRAPHY N/A 05/01/2020   Procedure: LEFT HEART CATH AND CORONARY ANGIOGRAPHY;  Surgeon: Corky Crafts, MD;  Location: [facility] INVASIVE CV LAB;  Service: Cardiovascular;  Laterality: N/A;    Social History:   reports that he has quit smoking. He has never used smokeless tobacco. He reports that he does not drink alcohol and does not use drugs.  Allergies  Allergen Reactions  . 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 Amoxicillin Nausea Only  . Iodine-131 Other (See Comments)    Kidney problems    Family History  Problem Relation Age of Onset  . Arrhythmia Mother   . Atrial fibrillation Mother      Prior to  Admission medications   Medication Sig Start Date End Date Taking? Authorizing Provider  aspirin EC 81 MG tablet Take 1 tablet (81 mg total) by mouth daily. Swallow whole. 05/30/20 05/30/21  Corky CraftsVaranasi, Jayadeep S, MD  dapagliflozin propanediol (FARXIGA) 10 MG TABS tablet Take 1 tablet (10 mg total) by mouth daily. 05/30/20   Corky CraftsVaranasi, Jayadeep S, MD  escitalopram (LEXAPRO) 20 MG  tablet Take 20 mg by mouth daily. 05/28/16   [provider]  fenofibrate 160 MG tablet Take 1 tablet (160 mg total) by mouth daily. 05/30/20   Corky CraftsVaranasi, Jayadeep S, MD  insulin detemir (LEVEMIR) 100 UNIT/ML injection Inject 50 Units into the skin daily.    [provider]  lisinopril (ZESTRIL) 10 MG tablet Take 1 tablet (10 mg total) by mouth daily. 05/30/20   Corky CraftsVaranasi, Jayadeep S, MD  metoprolol succinate (TOPROL XL) 25 MG 24 hr tablet Take 0.5 tablets (12.5 mg total) by mouth daily. 05/30/20   Corky CraftsVaranasi, Jayadeep S, MD  Multiple Vitamins-Minerals (MULTIVITAMIN MEN 50+ PO) Take by mouth.    [provider]  niacin (NIASPAN) 1000 MG CR tablet Take 2 tablets (2,000 mg total) by mouth at bedtime. 06/28/20   Corky CraftsVaranasi, Jayadeep S, MD  nitroGLYCERIN (NITROSTAT) 0.4 MG SL tablet Place 1 tablet (0.4 mg total) under the tongue every 5 (five) minutes x 3 doses as needed for chest pain. 05/04/20   Bhagat, Sharrell KuBhavinkumar, PA  pantoprazole (PROTONIX) 40 MG tablet Take 40 mg by mouth daily. 04/09/20   [provider]  rosuvastatin (CRESTOR) 40 MG tablet Take 1 tablet (40 mg total) by mouth daily. 05/30/20   Corky CraftsVaranasi, Jayadeep S, MD  Semaglutide,0.25 or 0.5MG /DOS, (OZEMPIC, 0.25 OR 0.5 MG/DOSE,) 2 MG/1.5ML SOPN Inject 0.5 mg into the skin once a week.    [provider]  ticagrelor (BRILINTA) 90 MG TABS tablet Take 1 tablet (90 mg total) by mouth 2 (two) times daily. 05/30/20   Corky CraftsVaranasi, Jayadeep S, MD  VASCEPA 1 g capsule Take 2 g by mouth 2 (two) times daily. 04/13/20   [provider]    Physical Exam: Vitals:   07/20/20 2000 07/20/20 2015 07/20/20 2020 07/20/20 2030  BP: 108/71 113/71 108/61 (!) 104/57  Pulse: (!) 103 (!) 104 (!) 105 (!) 108  Resp: 19 17 18 18   Temp:      SpO2: 100% 99% 99% 98%  Weight:      Height:        Constitutional: NAD, calm  Eyes: PERTLA, lids and conjunctivae normal ENMT: Mucous membranes are moist. Posterior pharynx clear of any  exudate or lesions.   Neck: supple, no masses  Respiratory: no wheezing, no crackles. No accessory muscle use.  Cardiovascular: S1 & S2 heard, regular rate and rhythm. No extremity edema.  Abdomen: Soft, no distension, tender in epigastrium without rebound pain or guarding. Bowel sounds active.  Musculoskeletal: no clubbing / cyanosis. No joint deformity upper and lower extremities.   Skin: no significant rashes, lesions, ulcers. Warm, dry, well-perfused. Neurologic: CN 2-12 grossly intact. Sensation intact. Moving all extremities.  Psychiatric: Alert and oriented to person, place, and situation. Pleasant and cooperative.    Labs and Imaging on Admission: I have personally reviewed following labs and imaging studies  CBC: Recent Labs  Lab 07/20/20 1807  WBC 11.0*  NEUTROABS 8.3*  HGB 16.9  HCT 49.5  MCV 85.1  PLT 389   Basic Metabolic Panel: Recent Labs  Lab 07/20/20 1807  NA 129*  K 4.1  CL  97*  CO2 19*  GLUCOSE 223*  BUN 27*  CREATININE 1.15  CALCIUM 9.3   GFR: Estimated Creatinine Clearance: 88.4 mL/min (by C-G formula based on SCr of 1.15 mg/dL). Liver Function Tests: Recent Labs  Lab 07/20/20 1807  AST 24  ALT 29  ALKPHOS 59  BILITOT 1.0  PROT 7.6  ALBUMIN 3.9   Recent Labs  Lab 07/20/20 1807  LIPASE 30   No results for input(s): AMMONIA in the last 168 hours. Coagulation Profile: Recent Labs  Lab 07/20/20 1940  INR 1.1   Cardiac Enzymes: No results for input(s): CKTOTAL, CKMB, CKMBINDEX, TROPONINI in the last 168 hours. BNP (last 3 results) No results for input(s): PROBNP in the last 8760 hours. HbA1C: No results for input(s): HGBA1C in the last 72 hours. CBG: No results for input(s): GLUCAP in the last 168 hours. Lipid Profile: No results for input(s): CHOL, HDL, LDLCALC, TRIG, CHOLHDL, LDLDIRECT in the last 72 hours. Thyroid Function Tests: No results for input(s): TSH, T4TOTAL, FREET4, T3FREE, THYROIDAB in the last 72 hours. Anemia  Panel: No results for input(s): VITAMINB12, FOLATE, FERRITIN, TIBC, IRON, RETICCTPCT in the last 72 hours. Urine analysis: No results found for: COLORURINE, APPEARANCEUR, LABSPEC, PHURINE, GLUCOSEU, HGBUR, BILIRUBINUR, KETONESUR, PROTEINUR, UROBILINOGEN, NITRITE, LEUKOCYTESUR Sepsis Labs: (procalcitonin:4,lacticidven:4) ) Recent Results (from the past 240 hour(s))  Resp Panel by RT-PCR (Flu A&B, Covid) Nasopharyngeal Swab     Status: None   Collection Time: 07/20/20  7:40 PM   Specimen: Nasopharyngeal Swab; Nasopharyngeal(NP) swabs in vial transport medium  Result Value Ref Range Status   SARS Coronavirus 2 by RT PCR NEGATIVE NEGATIVE Final    Comment: (NOTE) SARS-CoV-2 target nucleic acids are NOT DETECTED.  The SARS-CoV-2 RNA is generally detectable in upper respiratory specimens during the acute phase of infection. The lowest concentration of SARS-CoV-2 viral copies this assay can detect is 138 copies/mL. A negative result does not preclude SARS-Cov-2 infection and should not be used as the sole basis for treatment or other patient management decisions. A negative result may occur with  improper specimen collection/handling, submission of specimen other than nasopharyngeal swab, presence of viral mutation(s) within the areas targeted by this assay, and inadequate number of viral copies(<138 copies/mL). A negative result must be combined with clinical observations, patient history, and epidemiological information. The expected result is Negative.  Fact Sheet for Patients:  BloggerCourse.com  Fact Sheet for Healthcare Providers:  SeriousBroker.it  This test is no t yet approved or cleared by the [place] FDA and  has been authorized for detection and/or diagnosis of SARS-CoV-2 by FDA under an Emergency Use Authorization (EUA). This EUA will remain  in effect (meaning this test can be used) for the duration of  the COVID-19 declaration under Section 564(b)(1) of the Act, 21 U.S.C.section 360bbb-3(b)(1), unless the authorization is terminated  or revoked sooner.       Influenza A by PCR NEGATIVE NEGATIVE Final   Influenza B by PCR NEGATIVE NEGATIVE Final    Comment: (NOTE) The Xpert Xpress SARS-CoV-2/FLU/RSV plus assay is intended as an aid in the diagnosis of influenza from Nasopharyngeal swab specimens and should not be used as a sole basis for treatment. Nasal washings and aspirates are unacceptable for Xpert Xpress SARS-CoV-2/FLU/RSV testing.  Fact Sheet for Patients: BloggerCourse.com  Fact Sheet for Healthcare Providers: SeriousBroker.it  This test is not yet approved or cleared by the [place] FDA and has been authorized for detection and/or diagnosis of SARS-CoV-2 by FDA under an Emergency  Use Authorization (EUA). This EUA will remain in effect (meaning this test can be used) for the duration of the COVID-19 declaration under Section 564(b)(1) of the Act, 21 U.S.C. section 360bbb-3(b)(1), unless the authorization is terminated or revoked.  Performed at [facility], [address] [place], [place] [address]      Radiological Exams on Admission: CT ABDOMEN PELVIS WO CONTRAST  Result Date: 07/20/2020 CLINICAL DATA:  Acute generalized abdominal pain. EXAM: CT ABDOMEN AND PELVIS WITHOUT CONTRAST TECHNIQUE: Multidetector CT imaging of the abdomen and pelvis was performed following the standard protocol without IV contrast. COMPARISON:  None. FINDINGS: Lower chest: No acute abnormality. Hepatobiliary: No focal liver abnormality is seen. Status post cholecystectomy. No biliary dilatation. Pancreas: Prominence of the pancreatic head is noted without ductal dilatation. Is uncertain if this represents possible acute pancreatitis or less likely mass. Spleen: Normal in size without focal abnormality. Adrenals/Urinary Tract:  Adrenal glands are unremarkable. Kidneys are normal, without renal calculi, focal lesion, or hydronephrosis. Bladder is unremarkable. Stomach/Bowel: The stomach appears normal. There is no evidence of bowel obstruction or inflammation. Status post appendectomy. Vascular/Lymphatic: Aortic atherosclerosis. No enlarged abdominal or pelvic lymph nodes. Reproductive: Prostate is unremarkable. Other: No abdominal wall hernia or abnormality. No abdominopelvic ascites. Musculoskeletal: No acute or significant osseous findings. IMPRESSION: Prominence of the pancreatic head is noted on this unenhanced scan. It is uncertain if this represents acute pancreatitis or potentially pancreatic mass. Further evaluation with MRI or CT scan with intravenous contrast is recommended. Aortic Atherosclerosis (ICD10-I70.0). Electronically Signed   By: Lupita Raider M.D.   On: 07/20/2020 19:22    EKG: Independently reviewed. Sinus tachycardia, rate 128.   Assessment/Plan   1. Pancreatitis  - Patient with hx of hypertriglyceride-induced pancreatitis presents with epigastric pain radiating to back similar to prior episodes, and has also had multiple episodes of diarrhea  - Triglycerides were 154 on 07/16/20, down from 2693 in March 2022  - CT demonstrates prominent pancreatic head, possibly related to pancreatitis or mass  - Lipase is normal but patient reports lipase and amylase were normal during his prior episodes as well  - He has been fluid-resuscitated with 3 liter LR in ED  - Check MRI to further characterize pancreatic finding on CT, continue bowel-rest, pain-control, and IVF hydration   2. Acute gastroenteritis  - Presents with one day of abdominal pain and N/V/D  - Prominent pancreatic head noted on non-contrast CT, MRI pending  - Wife just recovered from brief GI illness after they were both with someone with the same and acute viral GE likely  - GI pathogen panel and C diff testing ordered from ED  - Continue  IVF hydration, monitor electrolytes, follow-up stool studies   3. Hypotension  - SBP 80s-90s initially, now low 100s after 3 liters LR  - No overt bleeding, no fever or significant leukocytosis, lactate normal  - Likely secondary to hypovolemia in setting of N/V/D  - Hold antihypertensives, continue IVF hydration    4. Insulin-dependent DM  - A1c was 12.0% in March 2022  - Check CBGs and continue insulin    5. Hyperlipidemia  - Hx of severe hypertriglyceridemia (>5000) with multiple admissions for associated pancreatitis  - He was recently seen in lipid clinic, has been on Crestor, Vascepa, fenofibrate, and niacin, and triglycerides were down to 154 on 07/16/20 from 2693 in March 2022  - Continue Crestor, Vascepa, and fenofibrate, hold niacin until hypotension and tachycardia resolved    6.  CAD - History of STEMI treated with DES on 05/01/20 - No anginal complaints on admission  - Continue DAPT and high-intensity statin    7. Hyponatremia  - Serum sodium is 129 on admission in the setting of hypovoelmia, corrects to 131 when accounting for hyperglycemia  - He has been aggressively fluid-resuscitated with LR in ED  - Continue IVF hydration and repeat chem panel   8. SIRS  - Tachycardia and hypotension present on arrival without fever, elevated lactate, or significant leukocytosis  - He may have acute viral gastroenteritis and/or pancreatitis but tachycardia and hypotension are more likely due to hypovolemia in setting of N/V/D than sepsis  - Follow cultures and clinical course, hold further antibiotics for now     DVT prophylaxis: Lovenox  Code Status: Full  Level of Care: Level of care: Progressive Family Communication: Wife updated at bedside  Disposition Plan:  Patient is from: Home  Anticipated d/c is to: Home  Anticipated d/c date is: 07/23/20 Patient currently: Pending MRI, stool studies, improvement in BP and HR, pain-control, and tolerance of diet    Consults called: None   Admission status: Inpatient    Briscoe Deutscher, MD [facility]  07/20/2020, 10:20 PM

## 2020-07-21 DIAGNOSIS — E860 Dehydration: Secondary | ICD-10-CM

## 2020-07-21 DIAGNOSIS — K8500 Idiopathic acute pancreatitis without necrosis or infection: Secondary | ICD-10-CM

## 2020-07-21 LAB — GLUCOSE, CAPILLARY
Glucose-Capillary: 104 mg/dL — ABNORMAL HIGH (ref 70–99)
Glucose-Capillary: 107 mg/dL — ABNORMAL HIGH (ref 70–99)
Glucose-Capillary: 108 mg/dL — ABNORMAL HIGH (ref 70–99)
Glucose-Capillary: 109 mg/dL — ABNORMAL HIGH (ref 70–99)
Glucose-Capillary: 114 mg/dL — ABNORMAL HIGH (ref 70–99)
Glucose-Capillary: 119 mg/dL — ABNORMAL HIGH (ref 70–99)

## 2020-07-21 LAB — URINALYSIS, ROUTINE W REFLEX MICROSCOPIC
Bacteria, UA: NONE SEEN
Bilirubin Urine: NEGATIVE
Glucose, UA: >=500 mg/dL — AB
Hgb urine dipstick: NEGATIVE
Ketones, ur: 5 mg/dL — AB
Leukocytes,Ua: NEGATIVE
Nitrite: NEGATIVE
Protein, ur: NEGATIVE mg/dL
Specific Gravity, Urine: 1.032 — ABNORMAL HIGH (ref 1.005–1.030)
pH: 5 (ref 5.0–8.0)

## 2020-07-21 LAB — COMPREHENSIVE METABOLIC PANEL
ALT: 18 U/L (ref 0–44)
AST: 19 U/L (ref 15–41)
Albumin: 2.9 g/dL — ABNORMAL LOW (ref 3.5–5.0)
Alkaline Phosphatase: 42 U/L (ref 38–126)
Anion gap: 9 (ref 5–15)
BUN: 21 mg/dL — ABNORMAL HIGH (ref 6–20)
CO2: 21 mmol/L — ABNORMAL LOW (ref 22–32)
Calcium: 8.3 mg/dL — ABNORMAL LOW (ref 8.9–10.3)
Chloride: 103 mmol/L (ref 98–111)
Creatinine, Ser: 0.83 mg/dL (ref 0.61–1.24)
GFR, Estimated: >60 mL/min (ref >60)
Glucose, Bld: 146 mg/dL — ABNORMAL HIGH (ref 70–99)
Potassium: 3.2 mmol/L — ABNORMAL LOW (ref 3.5–5.1)
Sodium: 133 mmol/L — ABNORMAL LOW (ref 135–145)
Total Bilirubin: 0.5 mg/dL (ref 0.3–1.2)
Total Protein: 5.5 g/dL — ABNORMAL LOW (ref 6.5–8.1)

## 2020-07-21 LAB — CBC
HCT: 38.1 % — ABNORMAL LOW (ref 39.0–52.0)
Hemoglobin: 13 g/dL (ref 13.0–17.0)
MCH: 29 pg (ref 26.0–34.0)
MCHC: 34.1 g/dL (ref 30.0–36.0)
MCV: 84.9 fL (ref 80.0–100.0)
Platelets: 261 10*3/uL (ref 150–400)
RBC: 4.49 MIL/uL (ref 4.22–5.81)
RDW: 13.9 % (ref 11.5–15.5)
WBC: 7.9 10*3/uL (ref 4.0–10.5)
nRBC: 0 % (ref 0.0–0.2)

## 2020-07-21 LAB — HEMOGLOBIN A1C
Hgb A1c MFr Bld: 7.1 % — ABNORMAL HIGH (ref 4.8–5.6)
Mean Plasma Glucose: 157.07 mg/dL

## 2020-07-21 LAB — TRIGLYCERIDES: Triglycerides: 152 mg/dL — ABNORMAL HIGH (ref <150)

## 2020-07-21 LAB — AMYLASE: Amylase: 41 U/L (ref 28–100)

## 2020-07-21 MED ORDER — HYDROMORPHONE HCL 1 MG/ML IJ SOLN
1.0000 mg | INTRAMUSCULAR | Status: DC | PRN
  Administered 2020-07-21 – 2020-07-25 (×14): 1 mg via INTRAVENOUS
  Filled 2020-07-21 (×14): qty 1

## 2020-07-21 MED ORDER — POTASSIUM CHLORIDE CRYS ER 20 MEQ PO TBCR
40.0000 meq | EXTENDED_RELEASE_TABLET | Freq: Once | ORAL | Status: AC
  Administered 2020-07-21: 40 meq via ORAL
  Filled 2020-07-21: qty 2

## 2020-07-21 MED ORDER — LACTATED RINGERS IV SOLN: INTRAVENOUS | Status: DC

## 2020-07-21 NOTE — Plan of Care (Signed)

## 2020-07-21 NOTE — Progress Notes (Signed)
Admitted pt from ED. Vss. No acute distress noted. Admission done. Wife at bedside. Call bell with in reach. poc assumed.

## 2020-07-21 NOTE — Progress Notes (Signed)
PROGRESS NOTE    Dan Ford  RJJ:[id number] DOB: Oct 23, 1970 DOA: 07/20/2020 PCP: Shellia CleverlyBeane, Lori M, PA   Chief Complaint  Patient presents with  . Tachycardia    Brief Narrative:  Dan ChristenStephen M Laszlo is a 50 y.o. male with medical history significant for hypertriglyceridemia with multiple bouts of pancreatitis, STEMI on 05/01/2020 status post PCI with DES, insulin-dependent diabetes mellitus, and hypertension, now presenting to the emergency department for evaluation of abdominal pain, nausea, vomiting, and diarrhea.  Patient has been started on multiple new medications since his MI, including Ozempic, niacin, fenofibrate, aspirin, and Brilinta, had been doing fairly well until developing acute upper abdominal pain last night with nausea, vomiting, and multiple episodes of diarrhea.  His wife recently recovered from GI illness. Pt and wife reports that this is  5th episode of pancreatitis, . Initially it was thought to be secondary to Hypertriglyceridemia. Today's triglycerides is 153, lipase and amylase are wnl. CT of the abdomen and pelvis without contrast showed Prominence of the pancreatic head is noted on this unenhanced scan. MRI of the abdomen showed Mild fullness of the pancreatic head with questionable mild edema in the area of the pancreatic head. No sign of pancreatic mass or ductal dilation. No peripancreatic fluid collection. Findings may reflect mild open "groove" pancreatitis or could represent normal anatomic variant.  In view of his recurrent pancreatitis, we will request GI consult for recommendations and further evaluation.   Assessment & Plan:   Principal Problem:   Acute pancreatitis Active Problems:   Essential hypertension   Hyperlipidemia associated with type 2 diabetes mellitus (HCC)   Type 2 diabetes mellitus with hyperglycemia, with long-term current use of insulin (HCC)   Coronary artery disease   Hyponatremia   SIRS (systemic inflammatory response syndrome)  (HCC)   Hypotension due to hypovolemia   Diarrhea   Acute Pancreatitis:  - improving diarrhea, nausea and vomiting, abd pain is persistent.  LDL IS 152, improved from [id number] in 05/2020.  Symptomatic management with bowel rest, IV fluids and pain control.  In view of recurrent pancreatitis, GI consulted for recommendations.  Reviewed the MRi results with the patient and his wife at bedside.  Differential include gastroenteritis possibly viral .  GI pathogen and c diff ordered by admitting physician, but sample not sent as pt did not have any diarrhea since 4:30 yesterday afternoon.     Hypotension:  Resolved possibly from dehydration.  Lactate wnl.    Insulin dependent diabetes mellitus.  CBG (last 3)  Recent Labs    07/21/20 0017 07/21/20 0419 07/21/20 0751  GLUCAP 114* 109* 119*   Repeat A1c today.  cbg's are better controlled. Resume home meds.    Hyperlipidemia:  Resume crestor, vascepa. ,    H/o CAD S/P STEMI WITH DES in 05/2020.  No chest pain today.  Continue with DAPT and statin.     SIRS from acute pancreatitis.  Improving    Hyponatremia:  Improved with hydration.    Hypokalemia;  Replaced.   DVT prophylaxis: Lovenox.  Code Status: (Full/ code.  Family Communication: (family at bedside.  Disposition:   Status is: Inpatient  Remains inpatient appropriate because:Ongoing diagnostic testing needed not appropriate for outpatient work up, IV treatments appropriate due to intensity of illness or inability to take PO and Inpatient level of care appropriate due to severity of illness   Dispo: The patient is from: Home              Anticipated d/c is  to: Home              Patient currently is not medically stable to d/c.   Difficult to place patient No       Consultants:   Gastroenterology.    Procedures: MRI of the abdomen.    Antimicrobials: none.    Subjective: Pain not well controlled with IV fentanyl.   Objective: Vitals:    07/21/20 0914 07/21/20 0935 07/21/20 1007 07/21/20 1036  BP:      Pulse:      Resp: 18 20 18 18   Temp:      TempSrc:      SpO2:      Weight:      Height:        Intake/Output Summary (Last 24 hours) at 07/21/2020 1125 Last data filed at 07/21/2020 0431 Gross per 24 hour  Intake 3831.59 ml  Output --  Net 3831.59 ml   Filed Weights   07/20/20 1801 07/21/20 0501  Weight: 92.5 kg 92.8 kg    Examination:  General exam: Appears calm and comfortable  Respiratory system: Clear to auscultation. Respiratory effort normal. Cardiovascular system: S1 & S2 heard, RRR. No JVD, murmurs, rubs, gallops or clicks. No pedal edema. Gastrointestinal system: Abdomen is soft, generalized tenderness present. Bowel sounds minimal.  Central nervous system: Alert and oriented. No focal neurological deficits. Extremities: Symmetric 5 x 5 power. Skin: No rashes, lesions or ulcers      Data Reviewed: I have personally reviewed following labs and imaging studies  CBC: Recent Labs  Lab 07/20/20 1807 07/21/20 0132  WBC 11.0* 7.9  NEUTROABS 8.3*  --   HGB 16.9 13.0  HCT 49.5 38.1*  MCV 85.1 84.9  PLT 389 261    Basic Metabolic Panel: Recent Labs  Lab 07/20/20 1807 07/21/20 0132  NA 129* 133*  K 4.1 3.2*  CL 97* 103  CO2 19* 21*  GLUCOSE 223* 146*  BUN 27* 21*  CREATININE 1.15 0.83  CALCIUM 9.3 8.3*    GFR: Estimated Creatinine Clearance: 122.6 mL/min (by C-G formula based on SCr of 0.83 mg/dL).  Liver Function Tests: Recent Labs  Lab 07/20/20 1807 07/21/20 0132  AST 24 19  ALT 29 18  ALKPHOS 59 42  BILITOT 1.0 0.5  PROT 7.6 5.5*  ALBUMIN 3.9 2.9*    CBG: Recent Labs  Lab 07/21/20 0017 07/21/20 0419 07/21/20 0751  GLUCAP 114* 109* 119*     Recent Results (from the past 240 hour(s))  Resp Panel by RT-PCR (Flu A&B, Covid) Nasopharyngeal Swab     Status: None   Collection Time: 07/20/20  7:40 PM   Specimen: Nasopharyngeal Swab; Nasopharyngeal(NP) swabs in vial  transport medium  Result Value Ref Range Status   SARS Coronavirus 2 by RT PCR NEGATIVE NEGATIVE Final    Comment: (NOTE) SARS-CoV-2 target nucleic acids are NOT DETECTED.  The SARS-CoV-2 RNA is generally detectable in upper respiratory specimens during the acute phase of infection. The lowest concentration of SARS-CoV-2 viral copies this assay can detect is 138 copies/mL. A negative result does not preclude SARS-Cov-2 infection and should not be used as the sole basis for treatment or other patient management decisions. A negative result may occur with  improper specimen collection/handling, submission of specimen other than nasopharyngeal swab, presence of viral mutation(s) within the areas targeted by this assay, and inadequate number of viral copies(<138 copies/mL). A negative result must be combined with clinical observations, patient history, and epidemiological information. The expected result  is Negative.  Fact Sheet for Patients:  BloggerCourse.com  Fact Sheet for Healthcare Providers:  SeriousBroker.it  This test is no t yet approved or cleared by the [place] FDA and  has been authorized for detection and/or diagnosis of SARS-CoV-2 by FDA under an Emergency Use Authorization (EUA). This EUA will remain  in effect (meaning this test can be used) for the duration of the COVID-19 declaration under Section 564(b)(1) of the Act, 21 U.S.C.section 360bbb-3(b)(1), unless the authorization is terminated  or revoked sooner.       Influenza A by PCR NEGATIVE NEGATIVE Final   Influenza B by PCR NEGATIVE NEGATIVE Final    Comment: (NOTE) The Xpert Xpress SARS-CoV-2/FLU/RSV plus assay is intended as an aid in the diagnosis of influenza from Nasopharyngeal swab specimens and should not be used as a sole basis for treatment. Nasal washings and aspirates are unacceptable for Xpert Xpress SARS-CoV-2/FLU/RSV testing.  Fact  Sheet for Patients: BloggerCourse.com  Fact Sheet for Healthcare Providers: SeriousBroker.it  This test is not yet approved or cleared by the [place] FDA and has been authorized for detection and/or diagnosis of SARS-CoV-2 by FDA under an Emergency Use Authorization (EUA). This EUA will remain in effect (meaning this test can be used) for the duration of the COVID-19 declaration under Section 564(b)(1) of the Act, 21 U.S.C. section 360bbb-3(b)(1), unless the authorization is terminated or revoked.  Performed at [facility], [address], [place], [place] [address]          Radiology Studies: CT ABDOMEN PELVIS WO CONTRAST  Result Date: 07/20/2020 CLINICAL DATA:  Acute generalized abdominal pain. EXAM: CT ABDOMEN AND PELVIS WITHOUT CONTRAST TECHNIQUE: Multidetector CT imaging of the abdomen and pelvis was performed following the standard protocol without IV contrast. COMPARISON:  None. FINDINGS: Lower chest: No acute abnormality. Hepatobiliary: No focal liver abnormality is seen. Status post cholecystectomy. No biliary dilatation. Pancreas: Prominence of the pancreatic head is noted without ductal dilatation. Is uncertain if this represents possible acute pancreatitis or less likely mass. Spleen: Normal in size without focal abnormality. Adrenals/Urinary Tract: Adrenal glands are unremarkable. Kidneys are normal, without renal calculi, focal lesion, or hydronephrosis. Bladder is unremarkable. Stomach/Bowel: The stomach appears normal. There is no evidence of bowel obstruction or inflammation. Status post appendectomy. Vascular/Lymphatic: Aortic atherosclerosis. No enlarged abdominal or pelvic lymph nodes. Reproductive: Prostate is unremarkable. Other: No abdominal wall hernia or abnormality. No abdominopelvic ascites. Musculoskeletal: No acute or significant osseous findings. IMPRESSION: Prominence of the pancreatic head is noted  on this unenhanced scan. It is uncertain if this represents acute pancreatitis or potentially pancreatic mass. Further evaluation with MRI or CT scan with intravenous contrast is recommended. Aortic Atherosclerosis (ICD10-I70.0). Electronically Signed   By: Lupita Raider M.D.   On: 07/20/2020 19:22   MR ABDOMEN MRCP W WO CONTAST  Result Date: 07/21/2020 CLINICAL DATA:  Generalized abdominal pain, pancreatic abnormality in a 50 year old male. EXAM: MRI ABDOMEN WITHOUT AND WITH CONTRAST (INCLUDING MRCP) TECHNIQUE: Multiplanar multisequence MR imaging of the abdomen was performed both before and after the administration of intravenous contrast. Heavily T2-weighted images of the biliary and pancreatic ducts were obtained, and three-dimensional MRCP images were rendered by post processing. CONTRAST:  11mL GADAVIST GADOBUTROL 1 MMOL/ML IV SOLN COMPARISON:  CT of the abdomen and pelvis of Jul 20, 2020. FINDINGS: Lower chest: Is incidental imaging of the lung bases without sign of effusion or evidence of consolidative changes. Limited assessment on MRI. Hepatobiliary: Post cholecystectomy. No  sign of biliary duct dilation or discrete filling defect. In the common bile duct. No focal, suspicious hepatic lesion. The portal vein is patent. Hepatic veins are patent. Pancreas: Pancreas with preserved intrinsic T1 signal in the pancreatic head. No sign of abnormal enhancement., enhancement is preserved. No visible lesion in the area of the pancreatic head. No pancreatic ductal dilation. No substantial peripancreatic edema. Question mild edema about the pancreatic head. No adjacent fluid. Spleen:  Normal spleen. Adrenals/Urinary Tract: Adrenal glands are normal. Symmetric renal enhancement. No hydronephrosis. Stomach/Bowel: Visualized gastrointestinal tract without acute process to the extent evaluated on abdominal MRI. Vascular/Lymphatic: No pathologically enlarged lymph nodes identified. No abdominal aortic aneurysm  demonstrated. Other:  None. Musculoskeletal: No suspicious bone lesions identified. IMPRESSION: 1. Mild fullness of the pancreatic head with questionable mild edema in the area of the pancreatic head. No sign of pancreatic mass or ductal dilation. No peripancreatic fluid collection. Findings may reflect mild open "groove" pancreatitis or could represent normal anatomic variant. 2. Post cholecystectomy. No sign of biliary duct dilation or discrete filling defect. Electronically Signed   By: Donzetta Kohut M.D.   On: 07/21/2020 09:07        Scheduled Meds: . aspirin EC  81 mg Oral Daily  . enoxaparin (LOVENOX) injection  40 mg Subcutaneous Q24H  . escitalopram  20 mg Oral Daily  . fenofibrate  160 mg Oral Daily  . icosapent Ethyl  2 g Oral BID  . insulin aspart  0-9 Units Subcutaneous Q4H  . insulin detemir  25 Units Subcutaneous Daily  . pantoprazole  40 mg Oral Daily  . rosuvastatin  40 mg Oral Daily  . ticagrelor  90 mg Oral BID   Continuous Infusions: . lactated ringers 150 mL/hr at 07/21/20 0431     LOS: 1 day    Time spent: 36 minutes.     Kathlen Mody, MD [facility]   To contact the attending provider between 7A-7P or the covering provider during after hours 7P-7A, please log into the web site [email or web address] and access using universal [place] password for that web site. If you do not have the password, please call the hospital operator.  07/21/2020, 11:25 AM

## 2020-07-21 NOTE — Consult Note (Signed)
Consultation  Referring Provider: No ref. provider found Primary Care Physician:  Shellia Cleverly, PA Primary Gastroenterologist:  [facility] [place]  Reason for Consultation: Recurrent pancreatitis  HPI: Dan Ford is a 50 y.o. male, with history of adult onset diabetes mellitus, hypertriglyceridemia, hypertension, and coronary artery disease who is status post ST EMI March 2022.  He did require PCI and DES and has been on Brilinta and aspirin.  At the time of that admission he also was started on Ozempic and niacin as well as fenofibrate.  Patient had acute onset early yesterday with upper abdominal pain, multiple episodes of nausea and vomiting and at least 13 episodes of diarrhea which was watery and nonbloody.  No associated fever or chills.  He had gone to urgent care, was found to be hypotensive and referred to the emergency room for further evaluation. Patient says that this presentation is very similar to his prior episodes of pancreatitis.  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  The last episode was about 2 years ago and he also had nausea vomiting and diarrhea at onset. Patient does not drink any alcohol at all.  He did have dose of niacin increased about 2 weeks ago and dose of Ozempic was doubled about 2 weeks ago.  He also has prior history of C. difficile colitis x1 several years ago.  Etiology of previous episodes of pancreatitis not clear but felt likely secondary to hypertriglyceridemia.  Is status post remote cholecystectomy.  His lipids have been under much better control over the past 2 years though it was documented at 2693 in March and is down to 150 for now  He had been referred to [facility] in 2016 and was seen by Dr. Raylene Miyamoto and underwent EUS for further evaluation of the recurrent pancreatitis.  EUS was unremarkable, and no ductal abnormality or dilation.  Patient says he is feeling better today vomiting has stopped as  has the diarrhea, stool cultures were ordered but he has not had any stools.  He is tolerating some clear liquids though remains nauseated and says his pain is about an 8 out of 10   Past Medical History:  Diagnosis Date  . Coronary artery disease   . Diabetes mellitus without complication (HCC)   . Family history of adverse reaction to anesthesia   . Hyperlipidemia   . Hypertension     Past Surgical History:  Procedure Laterality Date  . APPENDECTOMY    . ARTHOSCOPIC ROTAOR CUFF REPAIR    . CHOLECYSTECTOMY    . CORONARY/GRAFT ACUTE MI REVASCULARIZATION N/A 05/01/2020   Procedure: Coronary/Graft Acute MI Revascularization;  Surgeon: Corky Crafts, MD;  Location: [facility] INVASIVE CV LAB;  Service: Cardiovascular;  Laterality: N/A;  . LEFT HEART CATH AND CORONARY ANGIOGRAPHY N/A 05/01/2020   Procedure: LEFT HEART CATH AND CORONARY ANGIOGRAPHY;  Surgeon: Corky Crafts, MD;  Location: [facility] INVASIVE CV LAB;  Service: Cardiovascular;  Laterality: N/A;    Prior to Admission medications   Medication Sig Start Date End Date Taking? Authorizing Provider  aspirin EC 81 MG tablet Take 1 tablet (81 mg total) by mouth daily. Swallow whole. 05/30/20 05/30/21 Yes Corky Crafts, MD  dapagliflozin propanediol (FARXIGA) 10 MG TABS tablet Take 1 tablet (10 mg total) by mouth daily. 05/30/20  Yes Corky Crafts, MD  escitalopram (LEXAPRO) 20 MG tablet Take 20 mg by mouth daily. 05/28/16  Yes [provider]  fenofibrate  160 MG tablet Take 1 tablet (160 mg total) by mouth daily. 05/30/20  Yes Corky Crafts, MD  insulin detemir (LEVEMIR) 100 UNIT/ML injection Inject 50 Units into the skin daily.   Yes [provider]  lisinopril (ZESTRIL) 10 MG tablet Take 1 tablet (10 mg total) by mouth daily. 05/30/20  Yes Corky Crafts, MD  metoprolol succinate (TOPROL XL) 25 MG 24 hr tablet Take 0.5 tablets (12.5 mg total) by mouth daily. 05/30/20  Yes Corky Crafts, MD   Multiple Vitamins-Minerals (MULTIVITAMIN MEN 50+ PO) Take by mouth.   Yes [provider]  niacin (NIASPAN) 1000 MG CR tablet Take 2 tablets (2,000 mg total) by mouth at bedtime. 06/28/20  Yes Corky Crafts, MD  nitroGLYCERIN (NITROSTAT) 0.4 MG SL tablet Place 1 tablet (0.4 mg total) under the tongue every 5 (five) minutes x 3 doses as needed for chest pain. 05/04/20  Yes Bhagat, Bhavinkumar, PA  pantoprazole (PROTONIX) 40 MG tablet Take 40 mg by mouth daily. 04/09/20  Yes [provider]  rosuvastatin (CRESTOR) 40 MG tablet Take 1 tablet (40 mg total) by mouth daily. 05/30/20  Yes Corky Crafts, MD  Semaglutide,0.25 or 0.5MG /DOS, (OZEMPIC, 0.25 OR 0.5 MG/DOSE,) 2 MG/1.5ML SOPN Inject 0.5 mg into the skin once a week. On Tuesdays   Yes [provider]  ticagrelor (BRILINTA) 90 MG TABS tablet Take 1 tablet (90 mg total) by mouth 2 (two) times daily. 05/30/20  Yes Corky Crafts, MD  VASCEPA 1 g capsule Take 2 g by mouth 2 (two) times daily. 04/13/20  Yes [provider]  Vitamin D, Ergocalciferol, (DRISDOL) 1.25 MG (50000 UNIT) CAPS capsule Take 50,000 Units by mouth every 7 (seven) days. 06/15/20  Yes [provider]  zaleplon (SONATA) 10 MG capsule Take 10 mg by mouth at bedtime as needed for sleep. 06/26/20  Yes [provider]    Current Facility-Administered Medications  Medication Dose Route Frequency Provider Last Rate Last Admin  . acetaminophen (TYLENOL) tablet 650 mg  650 mg Oral Q6H PRN Opyd, Lavone Neri, MD       Or  . acetaminophen (TYLENOL) suppository 650 mg  650 mg Rectal Q6H PRN Opyd, Lavone Neri, MD      . aspirin EC tablet 81 mg  81 mg Oral Daily Opyd, Lavone Neri, MD   81 mg at 07/21/20 0958  . enoxaparin (LOVENOX) injection 40 mg  40 mg Subcutaneous Q24H Opyd, Lavone Neri, MD   40 mg at 07/21/20 0035  . escitalopram (LEXAPRO) tablet 20 mg  20 mg Oral Daily Opyd, Lavone Neri, MD   20 mg at 07/21/20 0958  . fenofibrate  tablet 160 mg  160 mg Oral Daily Opyd, Lavone Neri, MD   160 mg at 07/21/20 0957  . HYDROmorphone (DILAUDID) injection 1 mg  1 mg Intravenous Q4H PRN Kathlen Mody, MD   1 mg at 07/21/20 1006  . icosapent Ethyl (VASCEPA) 1 g capsule 2 g  2 g Oral BID Opyd, Lavone Neri, MD   2 g at 07/21/20 0957  . insulin aspart (novoLOG) injection 0-9 Units  0-9 Units Subcutaneous Q4H Opyd, Timothy S, MD      . insulin detemir (LEVEMIR) injection 25 Units  25 Units Subcutaneous Daily Opyd, Lavone Neri, MD   25 Units at 07/21/20 1045  . lactated ringers infusion   Intravenous Continuous Kathlen Mody, MD 150 mL/hr at 07/21/20 1203 Rate Change at 07/21/20 1203  . ondansetron (ZOFRAN) tablet  4 mg  4 mg Oral Q6H PRN Opyd, Lavone Neri, MD       Or  . ondansetron (ZOFRAN) injection 4 mg  4 mg Intravenous Q6H PRN Opyd, Lavone Neri, MD      . pantoprazole (PROTONIX) EC tablet 40 mg  40 mg Oral Daily Opyd, Lavone Neri, MD   40 mg at 07/21/20 0958  . rosuvastatin (CRESTOR) tablet 40 mg  40 mg Oral Daily Opyd, Lavone Neri, MD   40 mg at 07/21/20 0957  . ticagrelor (BRILINTA) tablet 90 mg  90 mg Oral BID Briscoe Deutscher, MD   90 mg at 07/21/20 6948    Allergies as of 07/20/2020 - Review Complete 07/20/2020  Allergen Reaction Noted  . Amoxicillin-pot clavulanate Other (See Comments) 04/21/2014  . Iodine-131 Other (See Comments) 09/23/2017  . Amoxicillin Nausea Only 03/22/2019    Family History  Problem Relation Age of Onset  . Arrhythmia Mother   . Atrial fibrillation Mother     Social History   Socioeconomic History  . Marital status: Married    Spouse name: Not on file  . Number of children: Not on file  . Years of education: Not on file  . Highest education level: Not on file  Occupational History  . Not on file  Tobacco Use  . Smoking status: Former Games developer  . Smokeless tobacco: Never Used  Vaping Use  . Vaping Use: Never used  Substance and Sexual Activity  . Alcohol use: Never  . Drug use: Never  . Sexual  activity: Not on file  Other Topics Concern  . Not on file  Social History Narrative  . Not on file   Social Determinants of Health   Financial Resource Strain: Not on file  Food Insecurity: Not on file  Transportation Needs: Not on file  Physical Activity: Not on file  Stress: Not on file  Social Connections: Not on file  Intimate Partner Violence: Not on file    Review of Systems: Gen: Denies any fever, chills, sweats, anorexia, fatigue, weakness, malaise, weight loss, and sleep disorder CV: Denies chest pain, angina, palpitations, syncope, orthopnea, PND, peripheral edema, and claudication. Resp: Denies dyspnea at rest, dyspnea with exercise, cough, sputum, wheezing, coughing up blood, and pleurisy. GI: Denies vomiting blood, jaundice, and fecal incontinence.   Denies dysphagia or odynophagia. GU : Denies urinary burning, blood in urine, urinary frequency, urinary hesitancy, nocturnal urination, and urinary incontinence. MS: Denies joint pain, limitation of movement, and swelling, stiffness, low back pain, extremity pain. Denies muscle weakness, cramps, atrophy.  Derm: Denies rash, itching, dry skin, hives, moles, warts, or unhealing ulcers.  Psych: Denies depression, anxiety, memory loss, suicidal ideation, hallucinations, paranoia, and confusion. Heme: Denies bruising, bleeding, and enlarged lymph nodes. Neuro:  Denies any headaches, dizziness, paresthesias. Endo:  Denies any problems with DM, thyroid, adrenal function.  Physical Exam: Vital signs in last 24 hours: Temp:  [97 F (36.1 C)-98.6 F (37 C)] 97.9 F (36.6 C) (05/21 1156) Pulse Rate:  [86-128] 87 (05/21 1200) Resp:  [16-36] 18 (05/21 1156) BP: (87-125)/(57-83) 125/74 (05/21 1200) SpO2:  [94 %-100 %] 98 % (05/21 1200) Weight:  [92.5 kg-92.8 kg] 92.8 kg (05/21 0501) Last BM Date: 07/21/20 General:   Alert,  Well-developed, well-nourished white male, pleasant and cooperative in NAD Head:  Normocephalic and  atraumatic. Eyes:  Sclera clear, no icterus.   Conjunctiva pink. Ears:  Normal auditory acuity. Nose:  No deformity, discharge,  or lesions. Mouth:  No deformity  or lesions.   Neck:  Supple; no masses or thyromegaly. Lungs:  Clear throughout to auscultation.   No wheezes, crackles, or rhonchi. Heart:  Regular rate and rhythm; no murmurs, clicks, rubs,  or gallops. Abdomen:  Soft, he is tender across the upper abdomen with some guarding, no rebound BS active,nonpalp mass or hsm.   Rectal:  Deferred  Msk:  Symmetrical without gross deformities. . Pulses:  Normal pulses noted. Extremities:  Without clubbing or edema. Neurologic:  Alert and  oriented x4;  grossly normal neurologically. Skin:  Intact without significant lesions or rashes.. Psych:  Alert and cooperative. Normal mood and affect.  Intake/Output from previous day: 05/20 0701 - 05/21 0700 In: 3831.6 [P.O.:50; I.V.:581.4; IV Piggyback:3200.2] Out: -  Intake/Output this shift: Total I/O In: 723.2 [I.V.:723.2] Out: -   Lab Results: Recent Labs    07/20/20 1807 07/21/20 0132  WBC 11.0* 7.9  HGB 16.9 13.0  HCT 49.5 38.1*  PLT 389 261   BMET Recent Labs    07/20/20 1807 07/21/20 0132  NA 129* 133*  K 4.1 3.2*  CL 97* 103  CO2 19* 21*  GLUCOSE 223* 146*  BUN 27* 21*  CREATININE 1.15 0.83  CALCIUM 9.3 8.3*   LFT Recent Labs    07/21/20 0132  PROT 5.5*  ALBUMIN 2.9*  AST 19  ALT 18  ALKPHOS 42  BILITOT 0.5   PT/INR Recent Labs    07/20/20 1940  LABPROT 14.4  INR 1.1   Hepatitis Panel No results for input(s): HEPBSAG, HCVAB, HEPAIGM, HEPBIGM in the last 72 hours.    IMPRESSION:  #721 50 year old white male with history of recurrent acute pancreatitis with 4 prior episodes.  The last episode was about 2 years ago. Etiology of recurrent pancreatitis has not been entirely clear though he does have history of hypertriglyceridemia which is certainly a known cause. Onset of current illness yesterday  with acute onset of upper abdominal pain followed by multiple episodes of nausea vomiting and diarrhea.  Vomiting and diarrhea have resolved.  Interestingly patient says that this is occurred with his other episodes of pancreatitis as well which is unusual.  He has no significant pancreatitis by imaging this admission with MRI showing some very mild pancreatic head edema/question groove pancreatitis Lipase normal on admission  He had just been initiated on Ozempic about 2 months ago and dose was increased about 2 weeks ago.  Ozempic is certainly associated with pancreatitis and this may have triggered this episode.  Also consider autoimmune pancreatitis though less likely  #2 adult onset diabetes mellitus #3 hypokalemia correcting #4 status post remote cholecystectomy and appendectomy #5 coronary artery disease status post MI March 2022 status post PCI and drug-eluting stent-on Brilinta and aspirin  #6 hypertension    PLAN: Clear liquid diet, slowly advance as tolerates Ozempic will need to be permanently discontinued Continue aggressive management of hypertriglyceridemia We will check ANA/IgG4 to assess for autoimmune pancreatitis  Otherwise supportive management for now, could consider repeat EUS at some point as an outpatient when symptoms have completely resolved.  [facility] and can follow-up there on discharge.   Loghan Subia EsterwoodPA-C  07/21/2020, 12:47 PM

## 2020-07-22 LAB — COMPREHENSIVE METABOLIC PANEL
ALT: 20 U/L (ref 0–44)
AST: 20 U/L (ref 15–41)
Albumin: 2.7 g/dL — ABNORMAL LOW (ref 3.5–5.0)
Alkaline Phosphatase: 42 U/L (ref 38–126)
Anion gap: 6 (ref 5–15)
BUN: 8 mg/dL (ref 6–20)
CO2: 29 mmol/L (ref 22–32)
Calcium: 8.1 mg/dL — ABNORMAL LOW (ref 8.9–10.3)
Chloride: 101 mmol/L (ref 98–111)
Creatinine, Ser: 0.56 mg/dL — ABNORMAL LOW (ref 0.61–1.24)
GFR, Estimated: >60 mL/min (ref >60)
Glucose, Bld: 86 mg/dL (ref 70–99)
Potassium: 3.6 mmol/L (ref 3.5–5.1)
Sodium: 136 mmol/L (ref 135–145)
Total Bilirubin: 0.3 mg/dL (ref 0.3–1.2)
Total Protein: 5.2 g/dL — ABNORMAL LOW (ref 6.5–8.1)

## 2020-07-22 LAB — CBC
HCT: 35.1 % — ABNORMAL LOW (ref 39.0–52.0)
Hemoglobin: 11.6 g/dL — ABNORMAL LOW (ref 13.0–17.0)
MCH: 29 pg (ref 26.0–34.0)
MCHC: 33 g/dL (ref 30.0–36.0)
MCV: 87.8 fL (ref 80.0–100.0)
Platelets: 207 10*3/uL (ref 150–400)
RBC: 4 MIL/uL — ABNORMAL LOW (ref 4.22–5.81)
RDW: 13.3 % (ref 11.5–15.5)
WBC: 6.6 10*3/uL (ref 4.0–10.5)
nRBC: 0 % (ref 0.0–0.2)

## 2020-07-22 LAB — URINE CULTURE: Culture: NO GROWTH

## 2020-07-22 LAB — GLUCOSE, CAPILLARY
Glucose-Capillary: 107 mg/dL — ABNORMAL HIGH (ref 70–99)
Glucose-Capillary: 108 mg/dL — ABNORMAL HIGH (ref 70–99)
Glucose-Capillary: 113 mg/dL — ABNORMAL HIGH (ref 70–99)
Glucose-Capillary: 91 mg/dL (ref 70–99)
Glucose-Capillary: 97 mg/dL (ref 70–99)

## 2020-07-22 NOTE — Progress Notes (Signed)
PROGRESS NOTE    Dan ChristenStephen M Ford  ZOX:[id number] DOB: [birth year] DOA: 07/20/2020 PCP: Shellia CleverlyBeane, Lori M, PA   Chief Complaint  Patient presents with  . Tachycardia    Brief Narrative:  Dan ChristenStephen M Ford is a 50 y.o. male with medical history significant for hypertriglyceridemia with multiple bouts of pancreatitis, STEMI on 05/01/2020 status post PCI with DES, insulin-dependent diabetes mellitus, and hypertension, now presenting to the emergency department for evaluation of abdominal pain, nausea, vomiting, and diarrhea.  Patient has been started on multiple new medications since his MI, including Ozempic, niacin, fenofibrate, aspirin, and Brilinta, had been doing fairly well until developing acute upper abdominal pain last night with nausea, vomiting, and multiple episodes of diarrhea.  His wife recently recovered from GI illness. Pt and wife reports that this is  5th episode of pancreatitis, . Initially it was thought to be secondary to Hypertriglyceridemia. Today's triglycerides is 153, lipase and amylase are wnl. CT of the abdomen and pelvis without contrast showed Prominence of the pancreatic head is noted on this unenhanced scan. MRI of the abdomen showed Mild fullness of the pancreatic head with questionable mild edema in the area of the pancreatic head. No sign of pancreatic mass or ductal dilation. No peripancreatic fluid collection. Findings may reflect mild open "groove" pancreatitis or could represent normal anatomic variant.  In view of his recurrent pancreatitis, we will request GI consult for recommendations and further evaluation.   Assessment & Plan:   Principal Problem:   Acute pancreatitis Active Problems:   Essential hypertension   Hyperlipidemia associated with type 2 diabetes mellitus (HCC)   Type 2 diabetes mellitus with hyperglycemia, with long-term current use of insulin (HCC)   Coronary artery disease   Hyponatremia   SIRS (systemic inflammatory response syndrome)  (HCC)   Hypotension due to hypovolemia   Diarrhea   Acute Pancreatitis:  - improving diarrhea, nausea and vomiting, abd pain is persistent.  LDL IS 152, improved from [id number] in 05/2020.  Symptomatic management with bowel rest, IV fluids and pain control.  In view of recurrent pancreatitis, GI consulted for recommendations.  GI recommending to discontinue OZEMPIC, obtain lab work to assess for autoimmune pancreatitis and repeat endoscopic ultrasound as an outpatient when acute symptoms resolved to assess for pancreatic stricture. Patient started on clear liquid diet, advance as tolerated. Reviewed the MRi results with the patient and his wife at bedside.  Differential include gastroenteritis possibly viral .  GI pathogen and c diff ordered by admitting physician, but sample not sent as pt did not have any diarrhea since 4:30 yesterday afternoon.     Hypotension:  Resolved possibly from dehydration.  Lactate wnl.    Insulin dependent diabetes mellitus.  Well controlled CBGs CBG (last 3)  Recent Labs    07/22/20 0021 07/22/20 0726 07/22/20 1125  GLUCAP 107* 91 108*   Hemoglobin A1c at 7.1 cbg's are better controlled. Resume home meds.    Hyperlipidemia:  Resume crestor, vascepa. ,    H/o CAD S/P STEMI WITH DES in 05/2020.  No chest pain today.  Continue with DAPT and statin.     SIRS from acute pancreatitis.  Resolved   Hyponatremia:  Improved with hydration.    Hypokalemia;  Replaced  DVT prophylaxis: Lovenox.  Code Status: (Full/ code.  Family Communication: (family at bedside.  Disposition:   Status is: Inpatient  Remains inpatient appropriate because:Ongoing diagnostic testing needed not appropriate for outpatient work up, IV treatments appropriate due to intensity of illness or  inability to take PO and Inpatient level of care appropriate due to severity of illness   Dispo: The patient is from: Home              Anticipated d/c is to: Home               Patient currently is not medically stable to d/c.   Difficult to place patient No       Consultants:   Gastroenterology.    Procedures: MRI of the abdomen.    Antimicrobials: none.    Subjective: Pain is about 7 out of 10 with IV Dilaudid  Objective: Vitals:   07/21/20 2042 07/22/20 0447 07/22/20 0741 07/22/20 1403  BP: 107/68 114/67 93/73 103/68  Pulse: 83 85  77  Resp: Temp: 98.7 F (37.1 C) 98.6 F (37 C) 98.6 F (37 C) 98 F (36.7 C)  TempSrc: Oral Oral Oral Oral  SpO2: 96% 96%  96%  Weight:  92.7 kg    Height:        Intake/Output Summary (Last 24 hours) at 07/22/2020 1439 Last data filed at 07/22/2020 1345 Gross per 24 hour  Intake 3606.1 ml  Output 3875 ml  Net -268.9 ml   Filed Weights   07/20/20 1801 07/21/20 0501 07/22/20 0447  Weight: 92.5 kg 92.8 kg 92.7 kg    Examination:  General exam: Well-developed gentleman not in any kind of distress  respiratory system: Clear to auscultation bilaterally, no wheezing or rhonchi Cardiovascular system: S1-S2 heard, regular rate rhythm, no JVD no pedal edema Gastrointestinal system: Abdomen is soft, persistent tenderness in the epigastric area bowel sounds normal Central nervous system: Alert and oriented, grossly nonfocal Extremities: No pedal edema Skin: No rashes seen Psychiatry:       Data Reviewed: I have personally reviewed following labs and imaging studies  CBC: Recent Labs  Lab 07/20/20 1807 07/21/20 0132 07/22/20 0334  WBC 11.0* 7.9 6.6  NEUTROABS 8.3*  --   --   HGB 16.9 13.0 11.6*  HCT 49.5 38.1* 35.1*  MCV 85.1 84.9 87.8  PLT 389 261 207    Basic Metabolic Panel: Recent Labs  Lab 07/20/20 1807 07/21/20 0132 07/22/20 0334  NA 129* 133* 136  K 4.1 3.2* 3.6  CL 97* 103 101  CO2 19* 21* 29  GLUCOSE 223* 146* 86  BUN 27* 21* 8  CREATININE 1.15 0.83 0.56*  CALCIUM 9.3 8.3* 8.1*    GFR: Estimated Creatinine Clearance: 127.2 mL/min (A) (by C-G formula based  on SCr of 0.56 mg/dL (L)).  Liver Function Tests: Recent Labs  Lab 07/20/20 1807 07/21/20 0132 07/22/20 0334  AST ALT ALKPHOS 59 42 42  BILITOT 1.0 0.5 0.3  PROT 7.6 5.5* 5.2*  ALBUMIN 3.9 2.9* 2.7*    CBG: Recent Labs  Lab 07/21/20 1706 07/21/20 2046 07/22/20 0021 07/22/20 0726 07/22/20 1125  GLUCAP 104* 107* 107* 91 108*     Recent Results (from the past 240 hour(s))  Blood Culture (routine x 2)     Status: None (Preliminary result)   Collection Time: 07/20/20  7:01 PM   Specimen: BLOOD  Result Value Ref Range Status   Specimen Description BLOOD SITE NOT SPECIFIED  Final   Special Requests   Final    BOTTLES DRAWN AEROBIC AND ANAEROBIC Blood Culture adequate volume   Culture   Final    NO GROWTH 2 DAYS Performed at [facility], [address], [place], [place] [address]    Report Status PENDING  Incomplete  Blood Culture (routine x 2)     Status: None (Preliminary result)   Collection Time: 07/20/20  7:36 PM   Specimen: BLOOD  Result Value Ref Range Status   Specimen Description BLOOD SITE NOT SPECIFIED  Final   Special Requests   Final    BOTTLES DRAWN AEROBIC AND ANAEROBIC Blood Culture adequate volume   Culture   Final    NO GROWTH 2 DAYS Performed at [facility], [address], [place], [place] [address]    Report Status PENDING  Incomplete  Resp Panel by RT-PCR (Flu A&B, Covid) Nasopharyngeal Swab     Status: None   Collection Time: 07/20/20  7:40 PM   Specimen: Nasopharyngeal Swab; Nasopharyngeal(NP) swabs in vial transport medium  Result Value Ref Range Status   SARS Coronavirus 2 by RT PCR NEGATIVE NEGATIVE Final    Comment: (NOTE) SARS-CoV-2 target nucleic acids are NOT DETECTED.  The SARS-CoV-2 RNA is generally detectable in upper respiratory specimens during the acute phase of infection. The lowest concentration of SARS-CoV-2 viral copies this assay can detect is 138 copies/mL. A negative result does not  preclude SARS-Cov-2 infection and should not be used as the sole basis for treatment or other patient management decisions. A negative result may occur with  improper specimen collection/handling, submission of specimen other than nasopharyngeal swab, presence of viral mutation(s) within the areas targeted by this assay, and inadequate number of viral copies(<138 copies/mL). A negative result must be combined with clinical observations, patient history, and epidemiological information. The expected result is Negative.  Fact Sheet for Patients:  BloggerCourse.com  Fact Sheet for Healthcare Providers:  SeriousBroker.it  This test is no t yet approved or cleared by the [place] FDA and  has been authorized for detection and/or diagnosis of SARS-CoV-2 by FDA under an Emergency Use Authorization (EUA). This EUA will remain  in effect (meaning this test can be used) for the duration of the COVID-19 declaration under Section 564(b)(1) of the Act, 21 U.S.C.section 360bbb-3(b)(1), unless the authorization is terminated  or revoked sooner.       Influenza A by PCR NEGATIVE NEGATIVE Final   Influenza B by PCR NEGATIVE NEGATIVE Final    Comment: (NOTE) The Xpert Xpress SARS-CoV-2/FLU/RSV plus assay is intended as an aid in the diagnosis of influenza from Nasopharyngeal swab specimens and should not be used as a sole basis for treatment. Nasal washings and aspirates are unacceptable for Xpert Xpress SARS-CoV-2/FLU/RSV testing.  Fact Sheet for Patients: BloggerCourse.com  Fact Sheet for Healthcare Providers: SeriousBroker.it  This test is not yet approved or cleared by the [place] FDA and has been authorized for detection and/or diagnosis of SARS-CoV-2 by FDA under an Emergency Use Authorization (EUA). This EUA will remain in effect (meaning this test can be used) for the  duration of the COVID-19 declaration under Section 564(b)(1) of the Act, 21 U.S.C. section 360bbb-3(b)(1), unless the authorization is terminated or revoked.  Performed at [facility], [address], [place], [place] [address]   Urine culture     Status: None   Collection Time: 07/20/20  9:51 PM   Specimen: Urine, Random  Result Value Ref Range Status   Specimen Description URINE, RANDOM  Final   Special Requests NONE  Final   Culture   Final    NO GROWTH Performed at [facility],  [address], [place], [place] [address]    Report Status 07/22/2020 FINAL  Final         Radiology Studies: CT ABDOMEN PELVIS WO CONTRAST  Result Date: 07/20/2020 CLINICAL DATA:  Acute generalized abdominal pain. EXAM: CT ABDOMEN AND PELVIS WITHOUT CONTRAST TECHNIQUE: Multidetector CT imaging of the abdomen and pelvis was performed following the standard protocol without IV contrast. COMPARISON:  None. FINDINGS: Lower chest: No acute abnormality. Hepatobiliary: No focal liver abnormality is seen. Status post cholecystectomy. No biliary dilatation. Pancreas: Prominence of the pancreatic head is noted without ductal dilatation. Is uncertain if this represents possible acute pancreatitis or less likely mass. Spleen: Normal in size without focal abnormality. Adrenals/Urinary Tract: Adrenal glands are unremarkable. Kidneys are normal, without renal calculi, focal lesion, or hydronephrosis. Bladder is unremarkable. Stomach/Bowel: The stomach appears normal. There is no evidence of bowel obstruction or inflammation. Status post appendectomy. Vascular/Lymphatic: Aortic atherosclerosis. No enlarged abdominal or pelvic lymph nodes. Reproductive: Prostate is unremarkable. Other: No abdominal wall hernia or abnormality. No abdominopelvic ascites. Musculoskeletal: No acute or significant osseous findings. IMPRESSION: Prominence of the pancreatic head is noted on this unenhanced scan. It is uncertain if this  represents acute pancreatitis or potentially pancreatic mass. Further evaluation with MRI or CT scan with intravenous contrast is recommended. Aortic Atherosclerosis (ICD10-I70.0). Electronically Signed   By: Lupita Raider M.D.   On: 07/20/2020 19:22   MR ABDOMEN MRCP W WO CONTAST  Result Date: 07/21/2020 CLINICAL DATA:  Generalized abdominal pain, pancreatic abnormality in a 50 year old male. EXAM: MRI ABDOMEN WITHOUT AND WITH CONTRAST (INCLUDING MRCP) TECHNIQUE: Multiplanar multisequence MR imaging of the abdomen was performed both before and after the administration of intravenous contrast. Heavily T2-weighted images of the biliary and pancreatic ducts were obtained, and three-dimensional MRCP images were rendered by post processing. CONTRAST:  57mL GADAVIST GADOBUTROL 1 MMOL/ML IV SOLN COMPARISON:  CT of the abdomen and pelvis of Jul 20, 2020. FINDINGS: Lower chest: Is incidental imaging of the lung bases without sign of effusion or evidence of consolidative changes. Limited assessment on MRI. Hepatobiliary: Post cholecystectomy. No sign of biliary duct dilation or discrete filling defect. In the common bile duct. No focal, suspicious hepatic lesion. The portal vein is patent. Hepatic veins are patent. Pancreas: Pancreas with preserved intrinsic T1 signal in the pancreatic head. No sign of abnormal enhancement., enhancement is preserved. No visible lesion in the area of the pancreatic head. No pancreatic ductal dilation. No substantial peripancreatic edema. Question mild edema about the pancreatic head. No adjacent fluid. Spleen:  Normal spleen. Adrenals/Urinary Tract: Adrenal glands are normal. Symmetric renal enhancement. No hydronephrosis. Stomach/Bowel: Visualized gastrointestinal tract without acute process to the extent evaluated on abdominal MRI. Vascular/Lymphatic: No pathologically enlarged lymph nodes identified. No abdominal aortic aneurysm demonstrated. Other:  None. Musculoskeletal: No  suspicious bone lesions identified. IMPRESSION: 1. Mild fullness of the pancreatic head with questionable mild edema in the area of the pancreatic head. No sign of pancreatic mass or ductal dilation. No peripancreatic fluid collection. Findings may reflect mild open "groove" pancreatitis or could represent normal anatomic variant. 2. Post cholecystectomy. No sign of biliary duct dilation or discrete filling defect. Electronically Signed   By: Donzetta Kohut M.D.   On: 07/21/2020 09:07        Scheduled Meds: . aspirin EC  81 mg Oral Daily  . enoxaparin (LOVENOX) injection  40 mg Subcutaneous Q24H  . escitalopram  20 mg Oral Daily  . fenofibrate  160 mg Oral  Daily  . icosapent Ethyl  2 g Oral BID  . insulin aspart  0-9 Units Subcutaneous Q4H  . insulin detemir  25 Units Subcutaneous Daily  . pantoprazole  40 mg Oral Daily  . rosuvastatin  40 mg Oral Daily  . ticagrelor  90 mg Oral BID   Continuous Infusions: . lactated ringers 150 mL/hr at 07/22/20 0740     LOS: 2 days    Time spent: 36 minutes.     Kathlen Mody, MD [facility]   To contact the attending provider between 7A-7P or the covering provider during after hours 7P-7A, please log into the web site [email or web address] and access using universal [place] password for that web site. If you do not have the password, please call the hospital operator.  07/22/2020, 2:39 PM

## 2020-07-23 LAB — CBC
HCT: 34.8 % — ABNORMAL LOW (ref 39.0–52.0)
Hemoglobin: 11.7 g/dL — ABNORMAL LOW (ref 13.0–17.0)
MCH: 29.2 pg (ref 26.0–34.0)
MCHC: 33.6 g/dL (ref 30.0–36.0)
MCV: 86.8 fL (ref 80.0–100.0)
Platelets: 197 10*3/uL (ref 150–400)
RBC: 4.01 MIL/uL — ABNORMAL LOW (ref 4.22–5.81)
RDW: 13.2 % (ref 11.5–15.5)
WBC: 5.5 10*3/uL (ref 4.0–10.5)
nRBC: 0 % (ref 0.0–0.2)

## 2020-07-23 LAB — COMPREHENSIVE METABOLIC PANEL
ALT: 23 U/L (ref 0–44)
AST: 30 U/L (ref 15–41)
Albumin: 2.7 g/dL — ABNORMAL LOW (ref 3.5–5.0)
Alkaline Phosphatase: 46 U/L (ref 38–126)
Anion gap: 5 (ref 5–15)
BUN: <5 mg/dL — ABNORMAL LOW (ref 6–20)
CO2: 29 mmol/L (ref 22–32)
Calcium: 8.4 mg/dL — ABNORMAL LOW (ref 8.9–10.3)
Chloride: 102 mmol/L (ref 98–111)
Creatinine, Ser: 0.58 mg/dL — ABNORMAL LOW (ref 0.61–1.24)
GFR, Estimated: >60 mL/min (ref >60)
Glucose, Bld: 128 mg/dL — ABNORMAL HIGH (ref 70–99)
Potassium: 3.7 mmol/L (ref 3.5–5.1)
Sodium: 136 mmol/L (ref 135–145)
Total Bilirubin: 0.4 mg/dL (ref 0.3–1.2)
Total Protein: 5.4 g/dL — ABNORMAL LOW (ref 6.5–8.1)

## 2020-07-23 LAB — ANTINUCLEAR ANTIBODIES, IFA: ANA Ab, IFA: NEGATIVE

## 2020-07-23 LAB — GLUCOSE, CAPILLARY
Glucose-Capillary: 135 mg/dL — ABNORMAL HIGH (ref 70–99)
Glucose-Capillary: 143 mg/dL — ABNORMAL HIGH (ref 70–99)
Glucose-Capillary: 157 mg/dL — ABNORMAL HIGH (ref 70–99)
Glucose-Capillary: 73 mg/dL (ref 70–99)
Glucose-Capillary: 89 mg/dL (ref 70–99)
Glucose-Capillary: 95 mg/dL (ref 70–99)

## 2020-07-23 LAB — IGG 4: IgG, Subclass 4: 21 mg/dL (ref 2–96)

## 2020-07-23 MED ORDER — OXYCODONE-ACETAMINOPHEN 5-325 MG PO TABS
1.0000 | ORAL_TABLET | ORAL | Status: DC | PRN
  Administered 2020-07-23 (×2): 1 via ORAL
  Administered 2020-07-23 – 2020-07-24 (×4): 2 via ORAL
  Administered 2020-07-24: 1 via ORAL
  Administered 2020-07-24 – 2020-07-25 (×3): 2 via ORAL
  Filled 2020-07-23: qty 1
  Filled 2020-07-23: qty 2
  Filled 2020-07-23: qty 1
  Filled 2020-07-23: qty 2
  Filled 2020-07-23: qty 1
  Filled 2020-07-23 (×5): qty 2

## 2020-07-23 NOTE — Plan of Care (Signed)

## 2020-07-23 NOTE — Progress Notes (Signed)
Bedside shift report complete. Received patient awake,alert/orientedx4 and able to verbalize needs. NAD noted; respirations easy/even on room air. Movement/sesnation to all extremities noted. IV fluids infusing at this time. Whiteboard updated. All safety measures in place and personal belongings within reach.

## 2020-07-23 NOTE — Progress Notes (Signed)
PROGRESS NOTE    Dan Ford  IWL:[id number] DOB: 08-23-1970 DOA: 07/20/2020 PCP: Shellia Cleverly, PA   Chief Complaint  Patient presents with  . Tachycardia    Brief Narrative:  Dan Ford is a 50 y.o. male with medical history significant for hypertriglyceridemia with multiple bouts of pancreatitis, STEMI on 05/01/2020 status post PCI with DES, insulin-dependent diabetes mellitus, and hypertension, now presenting to the emergency department for evaluation of abdominal pain, nausea, vomiting, and diarrhea.  Patient has been started on multiple new medications since his MI, including Ozempic, niacin, fenofibrate, aspirin, and Brilinta, had been doing fairly well until developing acute upper abdominal pain last night with nausea, vomiting, and multiple episodes of diarrhea.  His wife recently recovered from GI illness. Pt and wife reports that this is  5th episode of pancreatitis, . Initially it was thought to be secondary to Hypertriglyceridemia. Today's triglycerides is 153, lipase and amylase are wnl. CT of the abdomen and pelvis without contrast showed Prominence of the pancreatic head is noted on this unenhanced scan. MRI of the abdomen showed Mild fullness of the pancreatic head with questionable mild edema in the area of the pancreatic head. No sign of pancreatic mass or ductal dilation. No peripancreatic fluid collection. Findings may reflect mild open "groove" pancreatitis or could represent normal anatomic variant.  In view of his recurrent pancreatitis, we will request GI consult for recommendations and further evaluation.  GI recommended autoimmune work-up for recurrent pancreatitis and possible outpatient endoscopic ultrasound to look for anatomy. Patient seen and examined at bedside patient reports he is able to tolerate full liquid diet, will add Percocet to his pain regimen.   Assessment & Plan:   Principal Problem:   Acute pancreatitis Active Problems:   Essential  hypertension   Hyperlipidemia associated with type 2 diabetes mellitus (HCC)   Type 2 diabetes mellitus with hyperglycemia, with long-term current use of insulin (HCC)   Coronary artery disease   Hyponatremia   SIRS (systemic inflammatory response syndrome) (HCC)   Hypotension due to hypovolemia   Diarrhea   Acute Pancreatitis:  Improving, no nausea vomiting diarrhea abdominal pain has improved but not resolved yet. LDL IS 152, improved from 1740 in 05/2020.  Symptomatic management with bowel rest, IV fluids and pain control.  In view of recurrent pancreatitis, GI consulted for recommendations.  GI recommending to discontinue OZEMPIC, obtain lab work to assess for autoimmune pancreatitis and repeat endoscopic ultrasound as an outpatient when acute symptoms resolved to assess for pancreatic stricture. Patient started on clear liquid diet, advance as tolerated.  Patient currently on the full liquid diet and able to tolerate some Reviewed the MRi results with the patient and his wife at bedside.  Differential include gastroenteritis possibly viral .  GI pathogen and c diff ordered by admitting physician, but sample not sent as pt did not have any diarrhea    Hypotension:  Resolved possibly from dehydration.  Lactate wnl.    Insulin dependent diabetes mellitus.  Well controlled CBGs CBG (last 3)  Recent Labs    07/23/20 0148 07/23/20 0802 07/23/20 1200  GLUCAP 89 143* 73   Hemoglobin A1c at 7.1 cbg's are better controlled. Resume home meds.    Hyperlipidemia:  Resume crestor, vascepa. ,    H/o CAD S/P STEMI WITH DES in 05/2020.  No chest pain today.  Continue with DAPT and statin.     SIRS from acute pancreatitis.  Resolved   Hyponatremia:  Improved with hydration.  Hypokalemia;  Replaced   Mild anemia of chronic disease   DVT prophylaxis: Lovenox.  Code Status: (Full/ code.  Family Communication: (family at bedside.  Disposition:   Status is:  Inpatient  Remains inpatient appropriate because:Ongoing diagnostic testing needed not appropriate for outpatient work up, IV treatments appropriate due to intensity of illness or inability to take PO and Inpatient level of care appropriate due to severity of illness   Dispo: The patient is from: Home              Anticipated d/c is to: Home              Patient currently is not medically stable to d/c.   Difficult to place patient No       Consultants:   Gastroenterology.    Procedures: MRI of the abdomen.    Antimicrobials: none.    Subjective: Pain is improving, no nausea vomiting diarrhea today.  Objective: Vitals:   07/22/20 2010 07/23/20 0618 07/23/20 0944 07/23/20 1201  BP: 117/73 108/82 114/79 107/72  Pulse: 70 84 64 62  Resp: 18 18 18 18   Temp: 98.2 F (36.8 C) 98.7 F (37.1 C) 98.8 F (37.1 C) 97.9 F (36.6 C)  TempSrc: Oral Oral Oral Oral  SpO2: 98% 98% 98% 99%  Weight:  92.4 kg    Height:        Intake/Output Summary (Last 24 hours) at 07/23/2020 1257 Last data filed at 07/23/2020 1203 Gross per 24 hour  Intake 956 ml  Output 2925 ml  Net -1969 ml   Filed Weights   07/21/20 0501 07/22/20 0447 07/23/20 0618  Weight: 92.8 kg 92.7 kg 92.4 kg    Examination:  General exam: Well-developed gentleman not in any kind of distress. respiratory system: Clear to auscultation bilaterally, no wheezing or rhonchi Cardiovascular system: S1-S2 heard, regular rate rhythm, no JVD, no pedal edema Gastrointestinal system: Abdomen is soft, tender, bowel sounds normal Central nervous system: Alert and oriented, grossly nonfocal Extremities: No pedal edema, no cyanosis Skin: No rashes seen Psychiatry: Mood is appropriate      Data Reviewed: I have personally reviewed following labs and imaging studies  CBC: Recent Labs  Lab 07/20/20 1807 07/21/20 0132 07/22/20 0334 07/23/20 0732  WBC 11.0* 7.9 6.6 5.5  NEUTROABS 8.3*  --   --   --   HGB 16.9 13.0  11.6* 11.7*  HCT 49.5 38.1* 35.1* 34.8*  MCV 85.1 84.9 87.8 86.8  PLT 389 261 207 197    Basic Metabolic Panel: Recent Labs  Lab 07/20/20 1807 07/21/20 0132 07/22/20 0334 07/23/20 0732  NA 129* 133* 136 136  K 4.1 3.2* 3.6 3.7  CL 97* 103 101 102  CO2 19* 21* 29 29  GLUCOSE 223* 146* 86 128*  BUN 27* 21* 8 <5*  CREATININE 1.15 0.83 0.56* 0.58*  CALCIUM 9.3 8.3* 8.1* 8.4*    GFR: Estimated Creatinine Clearance: 126.9 mL/min (A) (by C-G formula based on SCr of 0.58 mg/dL (L)).  Liver Function Tests: Recent Labs  Lab 07/20/20 1807 07/21/20 0132 07/22/20 0334 07/23/20 0732  AST 24 19 20 30   ALT 29 18 20 23   ALKPHOS 59 42 42 46  BILITOT 1.0 0.5 0.3 0.4  PROT 7.6 5.5* 5.2* 5.4*  ALBUMIN 3.9 2.9* 2.7* 2.7*    CBG: Recent Labs  Lab 07/22/20 1543 07/22/20 2006 07/23/20 0148 07/23/20 0802 07/23/20 1200  GLUCAP 97 113* 89 143* 73     Recent Results (from the  past 240 hour(s))  Blood Culture (routine x 2)     Status: None (Preliminary result)   Collection Time: 07/20/20  7:01 PM   Specimen: BLOOD  Result Value Ref Range Status   Specimen Description BLOOD SITE NOT SPECIFIED  Final   Special Requests   Final    BOTTLES DRAWN AEROBIC AND ANAEROBIC Blood Culture adequate volume   Culture   Final    NO GROWTH 3 DAYS Performed at [facility], [address], [place], [place] [address]    Report Status PENDING  Incomplete  Blood Culture (routine x 2)     Status: None (Preliminary result)   Collection Time: 07/20/20  7:36 PM   Specimen: BLOOD  Result Value Ref Range Status   Specimen Description BLOOD SITE NOT SPECIFIED  Final   Special Requests   Final    BOTTLES DRAWN AEROBIC AND ANAEROBIC Blood Culture adequate volume   Culture   Final    NO GROWTH 3 DAYS Performed at [facility], [address], [place], [place] [address]    Report Status PENDING  Incomplete  Resp Panel by RT-PCR (Flu A&B, Covid) Nasopharyngeal Swab     Status: None    Collection Time: 07/20/20  7:40 PM   Specimen: Nasopharyngeal Swab; Nasopharyngeal(NP) swabs in vial transport medium  Result Value Ref Range Status   SARS Coronavirus 2 by RT PCR NEGATIVE NEGATIVE Final    Comment: (NOTE) SARS-CoV-2 target nucleic acids are NOT DETECTED.  The SARS-CoV-2 RNA is generally detectable in upper respiratory specimens during the acute phase of infection. The lowest concentration of SARS-CoV-2 viral copies this assay can detect is 138 copies/mL. A negative result does not preclude SARS-Cov-2 infection and should not be used as the sole basis for treatment or other patient management decisions. A negative result may occur with  improper specimen collection/handling, submission of specimen other than nasopharyngeal swab, presence of viral mutation(s) within the areas targeted by this assay, and inadequate number of viral copies(<138 copies/mL). A negative result must be combined with clinical observations, patient history, and epidemiological information. The expected result is Negative.  Fact Sheet for Patients:  BloggerCourse.com  Fact Sheet for Healthcare Providers:  SeriousBroker.it  This test is no t yet approved or cleared by the [place] FDA and  has been authorized for detection and/or diagnosis of SARS-CoV-2 by FDA under an Emergency Use Authorization (EUA). This EUA will remain  in effect (meaning this test can be used) for the duration of the COVID-19 declaration under Section 564(b)(1) of the Act, 21 U.S.C.section 360bbb-3(b)(1), unless the authorization is terminated  or revoked sooner.       Influenza A by PCR NEGATIVE NEGATIVE Final   Influenza B by PCR NEGATIVE NEGATIVE Final    Comment: (NOTE) The Xpert Xpress SARS-CoV-2/FLU/RSV plus assay is intended as an aid in the diagnosis of influenza from Nasopharyngeal swab specimens and should not be used as a sole basis for treatment.  Nasal washings and aspirates are unacceptable for Xpert Xpress SARS-CoV-2/FLU/RSV testing.  Fact Sheet for Patients: BloggerCourse.com  Fact Sheet for Healthcare Providers: SeriousBroker.it  This test is not yet approved or cleared by the [place] FDA and has been authorized for detection and/or diagnosis of SARS-CoV-2 by FDA under an Emergency Use Authorization (EUA). This EUA will remain in effect (meaning this test can be used) for the duration of the COVID-19 declaration under Section 564(b)(1) of the Act, 21 U.S.C. section 360bbb-3(b)(1), unless the  authorization is terminated or revoked.  Performed at [facility], [address] [place], [place] [address]   Urine culture     Status: None   Collection Time: 07/20/20  9:51 PM   Specimen: Urine, Random  Result Value Ref Range Status   Specimen Description URINE, RANDOM  Final   Special Requests NONE  Final   Culture   Final    NO GROWTH Performed at [facility], [address] [place], [place] [address]    Report Status 07/22/2020 FINAL  Final         Radiology Studies: No results found.      Scheduled Meds: . aspirin EC  81 mg Oral Daily  . enoxaparin (LOVENOX) injection  40 mg Subcutaneous Q24H  . escitalopram  20 mg Oral Daily  . fenofibrate  160 mg Oral Daily  . icosapent Ethyl  2 g Oral BID  . insulin aspart  0-9 Units Subcutaneous Q4H  . insulin detemir  25 Units Subcutaneous Daily  . pantoprazole  40 mg Oral Daily  . rosuvastatin  40 mg Oral Daily  . ticagrelor  90 mg Oral BID   Continuous Infusions: . lactated ringers 100 mL/hr at 07/23/20 0942     LOS: 3 days    Time spent: 36 minutes.     Kathlen ModyVijaya Omara Alcon, MD [facility]   To contact the attending provider between 7A-7P or the covering provider during after hours 7P-7A, please log into the web site [email or web address] and access using universal [place] password  for that web site. If you do not have the password, please call the hospital operator.  07/23/2020, 12:57 PM

## 2020-07-24 LAB — GLUCOSE, CAPILLARY
Glucose-Capillary: 100 mg/dL — ABNORMAL HIGH (ref 70–99)
Glucose-Capillary: 111 mg/dL — ABNORMAL HIGH (ref 70–99)
Glucose-Capillary: 134 mg/dL — ABNORMAL HIGH (ref 70–99)
Glucose-Capillary: 137 mg/dL — ABNORMAL HIGH (ref 70–99)
Glucose-Capillary: 149 mg/dL — ABNORMAL HIGH (ref 70–99)
Glucose-Capillary: 95 mg/dL (ref 70–99)

## 2020-07-24 NOTE — Plan of Care (Signed)

## 2020-07-24 NOTE — Progress Notes (Signed)
PROGRESS NOTE    Dan Ford  KKX:[id number] DOB: [birth year] DOA: 07/20/2020 PCP: Shellia Cleverly, PA   Chief Complaint  Patient presents with  . Tachycardia    Brief Narrative:  Dan Ford is a 50 y.o. male with medical history significant for hypertriglyceridemia with multiple bouts of pancreatitis, STEMI on 05/01/2020 status post PCI with DES, insulin-dependent diabetes mellitus, and hypertension, now presenting to the emergency department for evaluation of abdominal pain, nausea, vomiting, and diarrhea.  Patient has been started on multiple new medications since his MI, including Ozempic, niacin, fenofibrate, aspirin, and Brilinta, had been doing fairly well until developing acute upper abdominal pain last night with nausea, vomiting, and multiple episodes of diarrhea.  His wife recently recovered from GI illness. Pt and wife reports that this is  5th episode of pancreatitis, . Initially it was thought to be secondary to Hypertriglyceridemia. Today's triglycerides is 153, lipase and amylase are wnl. CT of the abdomen and pelvis without contrast showed Prominence of the pancreatic head is noted on this unenhanced scan. MRI of the abdomen showed Mild fullness of the pancreatic head with questionable mild edema in the area of the pancreatic head. No sign of pancreatic mass or ductal dilation. No peripancreatic fluid collection. Findings may reflect mild open "groove" pancreatitis or could represent normal anatomic variant.  In view of his recurrent pancreatitis,consulted GI.  GI recommended autoimmune work-up for recurrent pancreatitis and possible outpatient endoscopic ultrasound to look for anatomy. Auto immune work up so far is negative, with negative ANA and IgG4 antibodies.  .   Assessment & Plan:   Principal Problem:   Acute pancreatitis Active Problems:   Essential hypertension   Hyperlipidemia associated with type 2 diabetes mellitus (HCC)   Type 2 diabetes mellitus with  hyperglycemia, with long-term current use of insulin (HCC)   Coronary artery disease   Hyponatremia   SIRS (systemic inflammatory response syndrome) (HCC)   Hypotension due to hypovolemia   Diarrhea   Acute recurrent pancreatitis:  Improving, no nausea ,vomiting, diarrhea,  abdominal pain has improved but not resolved yet.  Patient is still requiring IV Dilaudid and Percocet LDL IS 152, improved from 1696 in 05/2020.  Symptomatic management with bowel rest, IV fluids and pain control.  In view of recurrent pancreatitis, GI consulted for recommendations.  GI recommending to discontinue OZEMPIC, obtain lab work to assess for autoimmune pancreatitis and repeat endoscopic ultrasound as an outpatient when acute symptoms resolved to assess for pancreatic stricture. Patient started on clear liquid diet, advance as tolerated.  Patient is currently on full liquid diet, advance to soft diet today Reviewed the MRi results with the patient and his wife at bedside.  Differential include gastroenteritis possibly viral .  GI pathogen and c diff ordered by admitting physician, but sample not sent as pt did not have any diarrhea    Hypotension:  Resolved possibly from dehydration.  Lactate wnl.    Insulin dependent diabetes mellitus.  Well controlled CBGs CBG (last 3)  Recent Labs    07/24/20 0330 07/24/20 0825 07/24/20 1117  GLUCAP 95 134* 111*   Hemoglobin A1c at 7.1 cbg's are better controlled.  No changes in medications Continue with Levemir 25 units daily and sliding scale insulin   Hyperlipidemia:  Resume crestor, vascepa. ,    H/o CAD S/P STEMI WITH DES in 05/2020.  No chest pain today.  Continue with DAPT and statin.     SIRS from acute pancreatitis.  Resolved  Hyponatremia:  Improved with hydration.    Hypokalemia;  Replaced, recheck labs tomorrow   Mild anemia of chronic disease   DVT prophylaxis: Lovenox.  Code Status: (Full/ code.  Family Communication:  (family at bedside.  Disposition:   Status is: Inpatient  Remains inpatient appropriate because:Ongoing diagnostic testing needed not appropriate for outpatient work up, IV treatments appropriate due to intensity of illness or inability to take PO and Inpatient level of care appropriate due to severity of illness   Dispo: The patient is from: Home              Anticipated d/c is to: Home              Patient currently is not medically stable to d/c.   Difficult to place patient No       Consultants:   Gastroenterology.    Procedures: MRI of the abdomen.    Antimicrobials: none.    Subjective: Patient would like to advance diet from full liquids to soft diet today.  Objective: Vitals:   07/23/20 2104 07/24/20 0329 07/24/20 0747 07/24/20 1350  BP: 105/66 120/80 117/78 101/69  Pulse: 73 60 68 61  Resp: 20 18 17 17   Temp: 98.8 F (37.1 C) 97.9 F (36.6 C) 98.2 F (36.8 C) 98.1 F (36.7 C)  TempSrc: Oral  Oral Oral  SpO2: 96% 100% 100% 98%  Weight:      Height:        Intake/Output Summary (Last 24 hours) at 07/24/2020 1532 Last data filed at 07/24/2020 0754 Gross per 24 hour  Intake 300 ml  Output 3350 ml  Net -3050 ml   Filed Weights   07/21/20 0501 07/22/20 0447 07/23/20 0618  Weight: 92.8 kg 92.7 kg 92.4 kg    Examination:  General exam: Well-developed gentleman not in any kind of distress. respiratory system: Clear to auscultation bilaterally, no wheezing or rhonchi Cardiovascular system: S1-S2 heard, regular rate rhythm, no JVD no pedal edema Gastrointestinal system: Abdomen is soft moderate tenderness in the epigastric area, nondistended bowel sounds normal Central nervous system: Alert and oriented, grossly nonfocal Extremities: No pedal edema Skin: No rashes seen Psychiatry: Mood is appropriate      Data Reviewed: I have personally reviewed following labs and imaging studies  CBC: Recent Labs  Lab 07/20/20 1807 07/21/20 0132  07/22/20 0334 07/23/20 0732  WBC 11.0* 7.9 6.6 5.5  NEUTROABS 8.3*  --   --   --   HGB 16.9 13.0 11.6* 11.7*  HCT 49.5 38.1* 35.1* 34.8*  MCV 85.1 84.9 87.8 86.8  PLT 389 261 207 197    Basic Metabolic Panel: Recent Labs  Lab 07/20/20 1807 07/21/20 0132 07/22/20 0334 07/23/20 0732  NA 129* 133* 136 136  K 4.1 3.2* 3.6 3.7  CL 97* 103 101 102  CO2 19* 21* 29 29  GLUCOSE 223* 146* 86 128*  BUN 27* 21* 8 <5*  CREATININE 1.15 0.83 0.56* 0.58*  CALCIUM 9.3 8.3* 8.1* 8.4*    GFR: Estimated Creatinine Clearance: 126.9 mL/min (A) (by C-G formula based on SCr of 0.58 mg/dL (L)).  Liver Function Tests: Recent Labs  Lab 07/20/20 1807 07/21/20 0132 07/22/20 0334 07/23/20 0732  AST 24 19 20 30   ALT 29 18 20 23   ALKPHOS 59 42 42 46  BILITOT 1.0 0.5 0.3 0.4  PROT 7.6 5.5* 5.2* 5.4*  ALBUMIN 3.9 2.9* 2.7* 2.7*    CBG: Recent Labs  Lab 07/23/20 2021 07/23/20 2334 07/24/20 0330  07/24/20 0825 07/24/20 1117  GLUCAP 157* 135* 95 134* 111*     Recent Results (from the past 240 hour(s))  Blood Culture (routine x 2)     Status: None (Preliminary result)   Collection Time: 07/20/20  7:01 PM   Specimen: BLOOD  Result Value Ref Range Status   Specimen Description BLOOD SITE NOT SPECIFIED  Final   Special Requests   Final    BOTTLES DRAWN AEROBIC AND ANAEROBIC Blood Culture adequate volume   Culture   Final    NO GROWTH 4 DAYS Performed at [facility], [address], [place], [place] [address]    Report Status PENDING  Incomplete  Blood Culture (routine x 2)     Status: None (Preliminary result)   Collection Time: 07/20/20  7:36 PM   Specimen: BLOOD  Result Value Ref Range Status   Specimen Description BLOOD SITE NOT SPECIFIED  Final   Special Requests   Final    BOTTLES DRAWN AEROBIC AND ANAEROBIC Blood Culture adequate volume   Culture   Final    NO GROWTH 4 DAYS Performed at [facility], [address], [place], [place] [address]    Report  Status PENDING  Incomplete  Resp Panel by RT-PCR (Flu A&B, Covid) Nasopharyngeal Swab     Status: None   Collection Time: 07/20/20  7:40 PM   Specimen: Nasopharyngeal Swab; Nasopharyngeal(NP) swabs in vial transport medium  Result Value Ref Range Status   SARS Coronavirus 2 by RT PCR NEGATIVE NEGATIVE Final    Comment: (NOTE) SARS-CoV-2 target nucleic acids are NOT DETECTED.  The SARS-CoV-2 RNA is generally detectable in upper respiratory specimens during the acute phase of infection. The lowest concentration of SARS-CoV-2 viral copies this assay can detect is 138 copies/mL. A negative result does not preclude SARS-Cov-2 infection and should not be used as the sole basis for treatment or other patient management decisions. A negative result may occur with  improper specimen collection/handling, submission of specimen other than nasopharyngeal swab, presence of viral mutation(s) within the areas targeted by this assay, and inadequate number of viral copies(<138 copies/mL). A negative result must be combined with clinical observations, patient history, and epidemiological information. The expected result is Negative.  Fact Sheet for Patients:  BloggerCourse.com  Fact Sheet for Healthcare Providers:  SeriousBroker.it  This test is no t yet approved or cleared by the [place] FDA and  has been authorized for detection and/or diagnosis of SARS-CoV-2 by FDA under an Emergency Use Authorization (EUA). This EUA will remain  in effect (meaning this test can be used) for the duration of the COVID-19 declaration under Section 564(b)(1) of the Act, 21 U.S.C.section 360bbb-3(b)(1), unless the authorization is terminated  or revoked sooner.       Influenza A by PCR NEGATIVE NEGATIVE Final   Influenza B by PCR NEGATIVE NEGATIVE Final    Comment: (NOTE) The Xpert Xpress SARS-CoV-2/FLU/RSV plus assay is intended as an aid in the diagnosis  of influenza from Nasopharyngeal swab specimens and should not be used as a sole basis for treatment. Nasal washings and aspirates are unacceptable for Xpert Xpress SARS-CoV-2/FLU/RSV testing.  Fact Sheet for Patients: BloggerCourse.com  Fact Sheet for Healthcare Providers: SeriousBroker.it  This test is not yet approved or cleared by the [place] FDA and has been authorized for detection and/or diagnosis of SARS-CoV-2 by FDA under an Emergency Use Authorization (EUA). This EUA will remain in effect (meaning this test can be used)  for the duration of the COVID-19 declaration under Section 564(b)(1) of the Act, 21 U.S.C. section 360bbb-3(b)(1), unless the authorization is terminated or revoked.  Performed at [facility], [address] [place], [place] [address]   Urine culture     Status: None   Collection Time: 07/20/20  9:51 PM   Specimen: Urine, Random  Result Value Ref Range Status   Specimen Description URINE, RANDOM  Final   Special Requests NONE  Final   Culture   Final    NO GROWTH Performed at [facility], [address], [place], [place] [address]    Report Status 07/22/2020 FINAL  Final         Radiology Studies: No results found.      Scheduled Meds: . aspirin EC  81 mg Oral Daily  . enoxaparin (LOVENOX) injection  40 mg Subcutaneous Q24H  . escitalopram  20 mg Oral Daily  . fenofibrate  160 mg Oral Daily  . icosapent Ethyl  2 g Oral BID  . insulin aspart  0-9 Units Subcutaneous Q4H  . insulin detemir  25 Units Subcutaneous Daily  . pantoprazole  40 mg Oral Daily  . rosuvastatin  40 mg Oral Daily  . ticagrelor  90 mg Oral BID   Continuous Infusions: . lactated ringers 100 mL/hr at 07/24/20 1453     LOS: 4 days    Time spent: 36 minutes.     Kathlen Mody, MD [facility]   To contact the attending provider between 7A-7P or the covering provider during after  hours 7P-7A, please log into the web site [email or web address] and access using universal [place] password for that web site. If you do not have the password, please call the hospital operator.  07/24/2020, 3:32 PM

## 2020-07-24 NOTE — Progress Notes (Signed)
Bedside shift report complete. Received patient awake,alert/orientedx4 and able to verbalize needs. NAD noted; respirations easy/even on room air. Movement/sesnation to all extremities noted. Whiteboard updated. All safety measures in place and personal belongings within reach.

## 2020-07-25 ENCOUNTER — Other Ambulatory Visit (HOSPITAL_COMMUNITY): Payer: Self-pay

## 2020-07-25 LAB — CULTURE, BLOOD (ROUTINE X 2)
Culture: NO GROWTH
Culture: NO GROWTH
Special Requests: ADEQUATE
Special Requests: ADEQUATE

## 2020-07-25 LAB — GLUCOSE, CAPILLARY
Glucose-Capillary: 106 mg/dL — ABNORMAL HIGH (ref 70–99)
Glucose-Capillary: 147 mg/dL — ABNORMAL HIGH (ref 70–99)

## 2020-07-25 MED ORDER — INSULIN DETEMIR 100 UNIT/ML ~~LOC~~ SOLN: 25.0000 [IU] | Freq: Every day | SUBCUTANEOUS | 11 refills | Status: AC

## 2020-07-25 MED ORDER — OXYCODONE-ACETAMINOPHEN 5-325 MG PO TABS
1.0000 | ORAL_TABLET | Freq: Four times a day (QID) | ORAL | 0 refills | Status: DC | PRN
  Filled 2020-07-25: qty 15, 4d supply, fill #0

## 2020-07-25 NOTE — Plan of Care (Signed)
  Problem: Education: Goal: Knowledge of General Education information will improve Description Including pain rating scale, medication(s)/side effects and non-pharmacologic comfort measures Outcome: Progressing   

## 2020-07-25 NOTE — Plan of Care (Signed)
  Problem: Education: Goal: Knowledge of General Education information will improve Description: Including pain rating scale, medication(s)/side effects and non-pharmacologic comfort measures 07/25/2020 1517 by Genevie Ann, RN Outcome: Adequate for Discharge 07/25/2020 0815 by Genevie Ann, RN Outcome: Progressing   Problem: Health Behavior/Discharge Planning: Goal: Ability to manage health-related needs will improve Outcome: Adequate for Discharge   Problem: Clinical Measurements: Goal: Ability to maintain clinical measurements within normal limits will improve Outcome: Adequate for Discharge Goal: Will remain free from infection Outcome: Adequate for Discharge Goal: Diagnostic test results will improve Outcome: Adequate for Discharge Goal: Respiratory complications will improve Outcome: Adequate for Discharge Goal: Cardiovascular complication will be avoided Outcome: Adequate for Discharge   Problem: Activity: Goal: Risk for activity intolerance will decrease Outcome: Adequate for Discharge   Problem: Nutrition: Goal: Adequate nutrition will be maintained Outcome: Adequate for Discharge   Problem: Coping: Goal: Level of anxiety will decrease Outcome: Adequate for Discharge   Problem: Elimination: Goal: Will not experience complications related to bowel motility Outcome: Adequate for Discharge Goal: Will not experience complications related to urinary retention Outcome: Adequate for Discharge   Problem: Pain Managment: Goal: General experience of comfort will improve Outcome: Adequate for Discharge   Problem: Safety: Goal: Ability to remain free from injury will improve Outcome: Adequate for Discharge   Problem: Skin Integrity: Goal: Risk for impaired skin integrity will decrease Outcome: Adequate for Discharge

## 2020-07-25 NOTE — Progress Notes (Signed)
Discharge instructions, RX's and follow up appts explained and provided to patient and wife verbalized understanding. Patient left floor via wheelchair accompanied by staff no c/o pain or shortness of breath at d/c.  [name], RN  

## 2020-07-25 NOTE — Plan of Care (Signed)
  Problem: Coping: Goal: Level of anxiety will decrease 07/25/2020 1518 by Genevie Ann, RN Outcome: Adequate for Discharge 07/25/2020 1518 by Genevie Ann, RN Outcome: Adequate for Discharge 07/25/2020 1517 by Genevie Ann, RN Outcome: Adequate for Discharge   Problem: Elimination: Goal: Will not experience complications related to bowel motility 07/25/2020 1518 by Genevie Ann, RN Outcome: Adequate for Discharge 07/25/2020 1518 by Genevie Ann, RN Outcome: Adequate for Discharge 07/25/2020 1517 by Genevie Ann, RN Outcome: Adequate for Discharge Goal: Will not experience complications related to urinary retention 07/25/2020 1518 by Genevie Ann, RN Outcome: Adequate for Discharge 07/25/2020 1518 by Genevie Ann, RN Outcome: Adequate for Discharge 07/25/2020 1517 by Genevie Ann, RN Outcome: Adequate for Discharge   Problem: Pain Managment: Goal: General experience of comfort will improve 07/25/2020 1518 by Genevie Ann, RN Outcome: Adequate for Discharge 07/25/2020 1518 by Genevie Ann, RN Outcome: Adequate for Discharge 07/25/2020 1517 by Genevie Ann, RN Outcome: Adequate for Discharge   Problem: Safety: Goal: Ability to remain free from injury will improve 07/25/2020 1518 by Genevie Ann, RN Outcome: Adequate for Discharge 07/25/2020 1518 by Genevie Ann, RN Outcome: Adequate for Discharge 07/25/2020 1517 by Genevie Ann, RN Outcome: Adequate for Discharge   Problem: Skin Integrity: Goal: Risk for impaired skin integrity will decrease 07/25/2020 1518 by Genevie Ann, RN Outcome: Adequate for Discharge 07/25/2020 1518 by Genevie Ann, RN Outcome: Adequate for Discharge 07/25/2020 1517 by Genevie Ann, RN Outcome: Adequate for Discharge

## 2020-07-25 NOTE — Discharge Summary (Signed)
Physician Discharge Summary  MARKEITH JUE RSW:[id number] DOB: [birth year] DOA: 07/20/2020  PCP: Shellia Cleverly, PA  Admit date: 07/20/2020 Discharge date: 07/25/2020  Admitted From: home Disposition:  home  Recommendations for Outpatient Follow-up:  1. Follow up with PCP in 1-2 weeks and w/ GI in 1-2 weeks 2. Please obtain BMP/CBC in one week 3. Please follow up on the following pending results:  Home Health:no  Equipment/Devices: none  Discharge Condition: Stable Code Status:   Code Status: Full Code Diet recommendation:  Diet Order            Diet Heart Room service appropriate? Yes; Fluid consistency: Thin  Diet effective now           Diet - low sodium heart healthy                  Brief/Interim Summary: 50 y.o.malewith medical history significant forhypertriglyceridemia with multiple bouts of pancreatitis, STEMI on 05/01/2020 status post PCI with DES, insulin-dependent diabetes mellitus, and hypertension, now presenting to the emergency department for evaluation of abdominal pain, nausea, vomiting, and diarrhea. Patient has been started on multiple new medications since his MI, including Ozempic, niacin, fenofibrate, aspirin, and Brilinta, had been doing fairly well until developing acute upperabdominal pain last night with nausea, vomiting, and multiple episodes of diarrhea.  His wife recently recovered from GI illness. Pt and wife reports that this is  5th episode of pancreatitis, . Initially it was thought to be secondary to Hypertriglyceridemia. Today's triglycerides is 153, lipase and amylase are wnl. CT of the abdomen and pelvis without contrast showed Prominence of the pancreatic head is noted on this unenhanced scan. MRI of the abdomen showed Mild fullness of the pancreatic head with questionable mild edema in the area of the pancreatic head. No sign of pancreatic mass or ductal dilation. No peripancreatic fluid collection. Findings may reflect mild open "groove"  pancreatitis or could represent normal anatomic variant. In view of his recurrent pancreatitis,consulted GI.  GI recommended autoimmune work-up for recurrent pancreatitis and possible outpatient endoscopic ultrasound to look for anatomy. Auto immune work up so far is negative, with negative ANA and IgG4 antibodies. Diet was slowly advanced and at this time tolerating low-fat diet.  He feels well.  Discharge Diagnoses:   Acute recurrent pancreatitis: Managed conservatively.  Seen by gastroenterology Darnelle Spangle hepatitis work-up negative with negative ANA, IgG4 antibodies.  At this time tolerating low-fat diet.  He was seen by GI and they will arrange for outpatient follow-up.  Patient underwent extensive work-up with TG 152, MRI abdomen.  GI advised to discontinue Ozempic.  Repeat EUS as outpatient from GI.  Hypotension due to dehydration resolved.  Lisinopril remains on hold can resume metoprolol monitor blood pressure at home Insulin-dependent diabetes mellitus A1c 7.1 blood sugar well controlled continue Levemir Hyperlipidemia continue his Crestor and Vascepa History of CAD status post [place]il[place] MI with DES in March/2022 no chest pain.  He is on DAPT, statin SIRS from pancreatitis-resolved Hyponatremia/hypokalemia resolved Overweight with BMI 28 will benefit with weight loss PCP follow-up Diarrhea-resolved, no sample available to test  Consults:  gi  Subjective: Alert and oriented patient resting comfortably.  Patient has been tolerating low-fat diet and feels ready for home today  Discharge Exam: Vitals:   07/25/20 0337 07/25/20 0735  BP: 120/86 111/79  Pulse: 73 61  Resp: 18 17  Temp: 97.6 F (36.4 C) 98 F (36.7 C)  SpO2: 100% 99%   General: Pt is alert, awake, not  in acute distress Cardiovascular: RRR, S1/S2 +, no rubs, no gallops Respiratory: CTA bilaterally, no wheezing, no rhonchi Abdominal: Soft, NT, ND, bowel sounds + Extremities: no edema, no cyanosis  Discharge  Instructions  Discharge Instructions    Diet - low sodium heart healthy   Complete by: As directed    Discharge instructions   Complete by: As directed    F/u with GI Doctor for  EUS, and hospital follow up or if any issues with abdomen. Please call call MD or return to ER for similar or worsening recurring problem that brought you to hospital or if any fever,nausea/vomiting,abdominal pain, uncontrolled pain, chest pain,  shortness of breath or any other alarming symptoms.  Please follow-up your doctor as instructed in a week time and call the office for appointment.  Please avoid alcohol, smoking, or any other illicit substance and maintain healthy habits including taking your regular medications as prescribed.  You were cared for by a hospitalist during your hospital stay. If you have any questions about your discharge medications or the care you received while you were in the hospital after you are discharged, you can call the unit and ask to speak with the hospitalist on call if the hospitalist that took care of you is not available.  Once you are discharged, your primary care physician will handle any further medical issues. Please note that NO REFILLS for any discharge medications will be authorized once you are discharged, as it is imperative that you return to your primary care physician (or establish a relationship with a primary care physician if you do not have one) for your aftercare needs so that they can reassess your need for medications and monitor your lab values   Check blood sugar 3 times a day and bedtime at home. If blood sugar running above 200 less than 70 please call your MD to adjust insulin. If blood sugars running less 100 do not use insulin and call MD. If you noticed signs and symptoms of hypoglycemia or low blood sugar like jitteriness, confusion, thirst, tremor, sweating- Check blood sugar, drink sugary drink/biscuits/sweets to increase sugar level and call MD or  return to ER.   Increase activity slowly   Complete by: As directed      Allergies as of 07/25/2020      Reactions   Amoxicillin-pot Clavulanate Other (See Comments)   Other reaction(s): Other (See Comments) Hx of Clostridium Difficile Enterocolitis on Augmentin Hx of Clostridium Difficile Enterocolitis on Augmentin Hx of Clostridium Difficile Enterocolitis on Augmentin   Iodine-131 Other (See Comments)   Kidney problems   Amoxicillin Nausea Only      Medication List    STOP taking these medications   lisinopril 10 MG tablet Commonly known as: ZESTRIL   Ozempic (0.25 or 0.5 MG/DOSE) 2 MG/1.5ML Sopn Generic drug: Semaglutide(0.25 or 0.5MG /DOS)     TAKE these medications   aspirin EC 81 MG tablet Take 1 tablet (81 mg total) by mouth daily. Swallow whole.   dapagliflozin propanediol 10 MG Tabs tablet Commonly known as: FARXIGA Take 1 tablet (10 mg total) by mouth daily.   escitalopram 20 MG tablet Commonly known as: LEXAPRO Take 20 mg by mouth daily.   fenofibrate 160 MG tablet Take 1 tablet (160 mg total) by mouth daily.   insulin detemir 100 UNIT/ML injection Commonly known as: LEVEMIR Inject 0.25 mLs (25 Units total) into the skin daily. What changed: how much to take   metoprolol succinate 25 MG 24 hr  tablet Commonly known as: Toprol XL Take 0.5 tablets (12.5 mg total) by mouth daily.   MULTIVITAMIN MEN 50+ PO Take by mouth.   niacin 1000 MG CR tablet Commonly known as: Niaspan Take 2 tablets (2,000 mg total) by mouth at bedtime.   nitroGLYCERIN 0.4 MG SL tablet Commonly known as: NITROSTAT Place 1 tablet (0.4 mg total) under the tongue every 5 (five) minutes x 3 doses as needed for chest pain.   oxyCODONE-acetaminophen 5-325 MG tablet Commonly known as: PERCOCET/ROXICET Take 1 tablet by mouth every 6 (six) hours as needed for up to 15 doses for severe pain.   pantoprazole 40 MG tablet Commonly known as: PROTONIX Take 40 mg by mouth daily.    rosuvastatin 40 MG tablet Commonly known as: CRESTOR Take 1 tablet (40 mg total) by mouth daily.   ticagrelor 90 MG Tabs tablet Commonly known as: BRILINTA Take 1 tablet (90 mg total) by mouth 2 (two) times daily.   Vascepa 1 g capsule Generic drug: icosapent Ethyl Take 2 g by mouth 2 (two) times daily.   Vitamin D (Ergocalciferol) 1.25 MG (50000 UNIT) Caps capsule Commonly known as: DRISDOL Take 50,000 Units by mouth every 7 (seven) days.   zaleplon 10 MG capsule Commonly known as: SONATA Take 10 mg by mouth at bedtime as needed for sleep.       Follow-up Information    Shellia CleverlyBeane, Lori M, PA Follow up in 1 week(s).   Specialty: General Practice Contact information: [address] [place] [address] [phone]        Corky CraftsVaranasi, Jayadeep S, MD .   Specialties: Cardiology, Radiology, Interventional Cardiology Contact information: [address] [place] [address] [phone]        Tressia DanasBeavers, Kimberly, MD Follow up in 1 week(s).   Specialty: Gastroenterology Contact information: [address] [place] [address] [phone]              Allergies  Allergen Reactions  . 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 Iodine-131 Other (See Comments)    Kidney problems  . Amoxicillin Nausea Only    The results of significant diagnostics from this hospitalization (including imaging, microbiology, ancillary and laboratory) are listed below for reference.    Microbiology: Recent Results (from the past 240 hour(s))  Blood Culture (routine x 2)     Status: None   Collection Time: 07/20/20  7:01 PM   Specimen: BLOOD  Result Value Ref Range Status   Specimen Description BLOOD SITE NOT SPECIFIED  Final   Special Requests    Final    BOTTLES DRAWN AEROBIC AND ANAEROBIC Blood Culture adequate volume   Culture   Final    NO GROWTH 5 DAYS Performed at [facility], [address] [place], [place] [address]    Report Status 07/25/2020 FINAL  Final  Blood Culture (routine x 2)     Status: None   Collection Time: 07/20/20  7:36 PM   Specimen: BLOOD  Result Value Ref Range Status   Specimen Description BLOOD SITE NOT SPECIFIED  Final   Special Requests   Final    BOTTLES DRAWN AEROBIC AND ANAEROBIC Blood Culture adequate volume   Culture   Final    NO GROWTH 5 DAYS Performed at [facility],  [address], [place], [place] [address]    Report Status 07/25/2020 FINAL  Final  Resp Panel by RT-PCR (Flu A&B, Covid) Nasopharyngeal Swab     Status: None   Collection Time: 07/20/20  7:40 PM   Specimen: Nasopharyngeal Swab; Nasopharyngeal(NP) swabs in vial transport medium  Result Value Ref Range Status   SARS Coronavirus 2 by RT PCR NEGATIVE NEGATIVE Final    Comment: (NOTE) SARS-CoV-2 target nucleic acids are NOT DETECTED.  The SARS-CoV-2 RNA is generally detectable in upper respiratory specimens during the acute phase of infection. The lowest concentration of SARS-CoV-2 viral copies this assay can detect is 138 copies/mL. A negative result does not preclude SARS-Cov-2 infection and should not be used as the sole basis for treatment or other patient management decisions. A negative result may occur with  improper specimen collection/handling, submission of specimen other than nasopharyngeal swab, presence of viral mutation(s) within the areas targeted by this assay, and inadequate number of viral copies(<138 copies/mL). A negative result must be combined with clinical observations, patient history, and epidemiological information. The expected result is Negative.  Fact Sheet for Patients:  BloggerCourse.com  Fact Sheet for Healthcare Providers:   SeriousBroker.it  This test is no t yet approved or cleared by the [place] FDA and  has been authorized for detection and/or diagnosis of SARS-CoV-2 by FDA under an Emergency Use Authorization (EUA). This EUA will remain  in effect (meaning this test can be used) for the duration of the COVID-19 declaration under Section 564(b)(1) of the Act, 21 U.S.C.section 360bbb-3(b)(1), unless the authorization is terminated  or revoked sooner.       Influenza A by PCR NEGATIVE NEGATIVE Final   Influenza B by PCR NEGATIVE NEGATIVE Final    Comment: (NOTE) The Xpert Xpress SARS-CoV-2/FLU/RSV plus assay is intended as an aid in the diagnosis of influenza from Nasopharyngeal swab specimens and should not be used as a sole basis for treatment. Nasal washings and aspirates are unacceptable for Xpert Xpress SARS-CoV-2/FLU/RSV testing.  Fact Sheet for Patients: BloggerCourse.com  Fact Sheet for Healthcare Providers: SeriousBroker.it  This test is not yet approved or cleared by the [place] FDA and has been authorized for detection and/or diagnosis of SARS-CoV-2 by FDA under an Emergency Use Authorization (EUA). This EUA will remain in effect (meaning this test can be used) for the duration of the COVID-19 declaration under Section 564(b)(1) of the Act, 21 U.S.C. section 360bbb-3(b)(1), unless the authorization is terminated or revoked.  Performed at [facility], [address] [place], [place] [address]   Urine culture     Status: None   Collection Time: 07/20/20  9:51 PM   Specimen: Urine, Random  Result Value Ref Range Status   Specimen Description URINE, RANDOM  Final   Special Requests NONE  Final   Culture   Final    NO GROWTH Performed at [facility], [address] [place], [place] [address]    Report Status 07/22/2020 FINAL  Final    Procedures/Studies: CT ABDOMEN PELVIS WO  CONTRAST  Result Date: 07/20/2020 CLINICAL DATA:  Acute generalized abdominal pain. EXAM: CT ABDOMEN AND PELVIS WITHOUT CONTRAST TECHNIQUE: Multidetector CT imaging of the abdomen and pelvis was performed following the standard protocol without IV contrast. COMPARISON:  None. FINDINGS: Lower chest: No acute abnormality. Hepatobiliary: No focal liver abnormality is seen. Status post cholecystectomy. No biliary dilatation. Pancreas: Prominence of the pancreatic head is noted without ductal dilatation. Is uncertain if  this represents possible acute pancreatitis or less likely mass. Spleen: Normal in size without focal abnormality. Adrenals/Urinary Tract: Adrenal glands are unremarkable. Kidneys are normal, without renal calculi, focal lesion, or hydronephrosis. Bladder is unremarkable. Stomach/Bowel: The stomach appears normal. There is no evidence of bowel obstruction or inflammation. Status post appendectomy. Vascular/Lymphatic: Aortic atherosclerosis. No enlarged abdominal or pelvic lymph nodes. Reproductive: Prostate is unremarkable. Other: No abdominal wall hernia or abnormality. No abdominopelvic ascites. Musculoskeletal: No acute or significant osseous findings. IMPRESSION: Prominence of the pancreatic head is noted on this unenhanced scan. It is uncertain if this represents acute pancreatitis or potentially pancreatic mass. Further evaluation with MRI or CT scan with intravenous contrast is recommended. Aortic Atherosclerosis (ICD10-I70.0). Electronically Signed   By: Lupita Raider M.D.   On: 07/20/2020 19:22   MR ABDOMEN MRCP W WO CONTAST  Result Date: 07/21/2020 CLINICAL DATA:  Generalized abdominal pain, pancreatic abnormality in a 50 year old male. EXAM: MRI ABDOMEN WITHOUT AND WITH CONTRAST (INCLUDING MRCP) TECHNIQUE: Multiplanar multisequence MR imaging of the abdomen was performed both before and after the administration of intravenous contrast. Heavily T2-weighted images of the biliary and  pancreatic ducts were obtained, and three-dimensional MRCP images were rendered by post processing. CONTRAST:  10mL GADAVIST GADOBUTROL 1 MMOL/ML IV SOLN COMPARISON:  CT of the abdomen and pelvis of Jul 20, 2020. FINDINGS: Lower chest: Is incidental imaging of the lung bases without sign of effusion or evidence of consolidative changes. Limited assessment on MRI. Hepatobiliary: Post cholecystectomy. No sign of biliary duct dilation or discrete filling defect. In the common bile duct. No focal, suspicious hepatic lesion. The portal vein is patent. Hepatic veins are patent. Pancreas: Pancreas with preserved intrinsic T1 signal in the pancreatic head. No sign of abnormal enhancement., enhancement is preserved. No visible lesion in the area of the pancreatic head. No pancreatic ductal dilation. No substantial peripancreatic edema. Question mild edema about the pancreatic head. No adjacent fluid. Spleen:  Normal spleen. Adrenals/Urinary Tract: Adrenal glands are normal. Symmetric renal enhancement. No hydronephrosis. Stomach/Bowel: Visualized gastrointestinal tract without acute process to the extent evaluated on abdominal MRI. Vascular/Lymphatic: No pathologically enlarged lymph nodes identified. No abdominal aortic aneurysm demonstrated. Other:  None. Musculoskeletal: No suspicious bone lesions identified. IMPRESSION: 1. Mild fullness of the pancreatic head with questionable mild edema in the area of the pancreatic head. No sign of pancreatic mass or ductal dilation. No peripancreatic fluid collection. Findings may reflect mild open "groove" pancreatitis or could represent normal anatomic variant. 2. Post cholecystectomy. No sign of biliary duct dilation or discrete filling defect. Electronically Signed   By: Donzetta Kohut M.D.   On: 07/21/2020 09:07    Labs: BNP (last 3 results) Recent Labs    05/02/20 0039 05/03/20 1128  BNP 23.3 52.6   Basic Metabolic Panel: Recent Labs  Lab 07/20/20 1807  07/21/20 0132 07/22/20 0334 07/23/20 0732  NA 129* 133* 136 136  K 4.1 3.2* 3.6 3.7  CL 97* 103 101 102  CO2 19* 21* 29 29  GLUCOSE 223* 146* 86 128*  BUN 27* 21* 8 <5*  CREATININE 1.15 0.83 0.56* 0.58*  CALCIUM 9.3 8.3* 8.1* 8.4*   Liver Function Tests: Recent Labs  Lab 07/20/20 1807 07/21/20 0132 07/22/20 0334 07/23/20 0732  AST ALT ALKPHOS 59 42 42 46  BILITOT 1.0 0.5 0.3 0.4  PROT 7.6 5.5* 5.2* 5.4*  ALBUMIN 3.9 2.9* 2.7* 2.7*   Recent Labs  Lab  07/20/20 1807 07/20/20 2349  LIPASE 30  --   AMYLASE  --  41   No results for input(s): AMMONIA in the last 168 hours. CBC: Recent Labs  Lab 07/20/20 1807 07/21/20 0132 07/22/20 0334 07/23/20 0732  WBC 11.0* 7.9 6.6 5.5  NEUTROABS 8.3*  --   --   --   HGB 16.9 13.0 11.6* 11.7*  HCT 49.5 38.1* 35.1* 34.8*  MCV 85.1 84.9 87.8 86.8  PLT 389 261 207 197   Cardiac Enzymes: No results for input(s): CKTOTAL, CKMB, CKMBINDEX, TROPONINI in the last 168 hours. BNP: Invalid input(s): POCBNP CBG: Recent Labs  Lab 07/24/20 1615 07/24/20 1957 07/24/20 2335 07/25/20 0337 07/25/20 1118  GLUCAP 149* 137* 100* 106* 147*   D-Dimer No results for input(s): DDIMER in the last 72 hours. Hgb A1c No results for input(s): HGBA1C in the last 72 hours. Lipid Profile No results for input(s): CHOL, HDL, LDLCALC, TRIG, CHOLHDL, LDLDIRECT in the last 72 hours. Thyroid function studies No results for input(s): TSH, T4TOTAL, T3FREE, THYROIDAB in the last 72 hours.  Invalid input(s): FREET3 Anemia work up No results for input(s): VITAMINB12, FOLATE, FERRITIN, TIBC, IRON, RETICCTPCT in the last 72 hours. Urinalysis    Component Value Date/Time   COLORURINE YELLOW 07/20/2020 1807   APPEARANCEUR CLEAR 07/20/2020 1807   LABSPEC 1.032 (H) 07/20/2020 1807   PHURINE 5.0 07/20/2020 1807   GLUCOSEU >=500 (A) 07/20/2020 1807   HGBUR NEGATIVE 07/20/2020 1807   BILIRUBINUR NEGATIVE 07/20/2020 1807    KETONESUR 5 (A) 07/20/2020 1807   PROTEINUR NEGATIVE 07/20/2020 1807   NITRITE NEGATIVE 07/20/2020 1807   LEUKOCYTESUR NEGATIVE 07/20/2020 1807   Sepsis Labs Invalid input(s): PROCALCITONIN,  WBC,  LACTICIDVEN Microbiology Recent Results (from the past 240 hour(s))  Blood Culture (routine x 2)     Status: None   Collection Time: 07/20/20  7:01 PM   Specimen: BLOOD  Result Value Ref Range Status   Specimen Description BLOOD SITE NOT SPECIFIED  Final   Special Requests   Final    BOTTLES DRAWN AEROBIC AND ANAEROBIC Blood Culture adequate volume   Culture   Final    NO GROWTH 5 DAYS Performed at [facility], [address] [place], [place] [address]    Report Status 07/25/2020 FINAL  Final  Blood Culture (routine x 2)     Status: None   Collection Time: 07/20/20  7:36 PM   Specimen: BLOOD  Result Value Ref Range Status   Specimen Description BLOOD SITE NOT SPECIFIED  Final   Special Requests   Final    BOTTLES DRAWN AEROBIC AND ANAEROBIC Blood Culture adequate volume   Culture   Final    NO GROWTH 5 DAYS Performed at [facility], [address], [place], [place] [address]    Report Status 07/25/2020 FINAL  Final  Resp Panel by RT-PCR (Flu A&B, Covid) Nasopharyngeal Swab     Status: None   Collection Time: 07/20/20  7:40 PM   Specimen: Nasopharyngeal Swab; Nasopharyngeal(NP) swabs in vial transport medium  Result Value Ref Range Status   SARS Coronavirus 2 by RT PCR NEGATIVE NEGATIVE Final    Comment: (NOTE) SARS-CoV-2 target nucleic acids are NOT DETECTED.  The SARS-CoV-2 RNA is generally detectable in upper respiratory specimens during the acute phase of infection. The lowest concentration of SARS-CoV-2 viral copies this assay can detect is 138 copies/mL. A negative result does not preclude SARS-Cov-2 infection and should not be used as the  sole basis for treatment or other patient management decisions. A negative result may occur with  improper specimen  collection/handling, submission of specimen other than nasopharyngeal swab, presence of viral mutation(s) within the areas targeted by this assay, and inadequate number of viral copies(<138 copies/mL). A negative result must be combined with clinical observations, patient history, and epidemiological information. The expected result is Negative.  Fact Sheet for Patients:  BloggerCourse.com  Fact Sheet for Healthcare Providers:  SeriousBroker.it  This test is no t yet approved or cleared by the [place] FDA and  has been authorized for detection and/or diagnosis of SARS-CoV-2 by FDA under an Emergency Use Authorization (EUA). This EUA will remain  in effect (meaning this test can be used) for the duration of the COVID-19 declaration under Section 564(b)(1) of the Act, 21 U.S.C.section 360bbb-3(b)(1), unless the authorization is terminated  or revoked sooner.       Influenza A by PCR NEGATIVE NEGATIVE Final   Influenza B by PCR NEGATIVE NEGATIVE Final    Comment: (NOTE) The Xpert Xpress SARS-CoV-2/FLU/RSV plus assay is intended as an aid in the diagnosis of influenza from Nasopharyngeal swab specimens and should not be used as a sole basis for treatment. Nasal washings and aspirates are unacceptable for Xpert Xpress SARS-CoV-2/FLU/RSV testing.  Fact Sheet for Patients: BloggerCourse.com  Fact Sheet for Healthcare Providers: SeriousBroker.it  This test is not yet approved or cleared by the [place] FDA and has been authorized for detection and/or diagnosis of SARS-CoV-2 by FDA under an Emergency Use Authorization (EUA). This EUA will remain in effect (meaning this test can be used) for the duration of the COVID-19 declaration under Section 564(b)(1) of the Act, 21 U.S.C. section 360bbb-3(b)(1), unless the authorization is terminated or revoked.  Performed at [facility], [address], [place], [place] [address]   Urine culture     Status: None   Collection Time: 07/20/20  9:51 PM   Specimen: Urine, Random  Result Value Ref Range Status   Specimen Description URINE, RANDOM  Final   Special Requests NONE  Final   Culture   Final    NO GROWTH Performed at [facility], [address] [place], [place] [address]    Report Status 07/22/2020 FINAL  Final     Time coordinating discharge: 25 minutes  SIGNED: Lanae Boast, MD  [facility] 07/25/2020, 2:07 PM  If 7PM-7AM, please contact night-coverage [email or web address]

## 2020-07-27 ENCOUNTER — Other Ambulatory Visit (INDEPENDENT_AMBULATORY_CARE_PROVIDER_SITE_OTHER): Payer: Self-pay

## 2020-07-27 NOTE — Addendum Note (Signed)
Addended by: Timouthy Gilardi E on: 07/27/2020 11:03 AM   Modules accepted: Orders

## 2020-07-27 NOTE — Telephone Encounter (Addendum)
Update - pt admitted 07/20/20 with pancreatitis (5th episode, last one was about 2 years ago), GI discontinued Ozempic. Labs suspiciously improved while admitted - TG 2693 2 months ago and were 152 in the hospital, A1c was 12 2 months ago and 7.1 in the hospital. Has repeat lipids scheduled 6/13 already, adding on another A1c to verify.

## 2020-08-13 ENCOUNTER — Other Ambulatory Visit: Payer: BC Managed Care – PPO

## 2020-08-13 ENCOUNTER — Other Ambulatory Visit: Payer: Self-pay

## 2020-08-13 DIAGNOSIS — E782 Mixed hyperlipidemia: Secondary | ICD-10-CM

## 2020-08-13 DIAGNOSIS — Z794 Long term (current) use of insulin: Secondary | ICD-10-CM

## 2020-08-13 LAB — HEPATIC FUNCTION PANEL
ALT: 28 IU/L (ref 0–44)
AST: 25 IU/L (ref 0–40)
Albumin: 4.5 g/dL (ref 3.8–4.9)
Alkaline Phosphatase: 102 IU/L (ref 44–121)
Bilirubin Total: 0.2 mg/dL (ref 0.0–1.2)
Bilirubin, Direct: <0.1 mg/dL (ref 0.00–0.40)
Total Protein: 7.3 g/dL (ref 6.0–8.5)

## 2020-08-13 LAB — LIPID PANEL
Chol/HDL Ratio: 3.7 ratio (ref 0.0–5.0)
Cholesterol, Total: 119 mg/dL (ref 100–199)
HDL: 32 mg/dL — ABNORMAL LOW (ref >39)
LDL Chol Calc (NIH): 43 mg/dL (ref 0–99)
Triglycerides: 287 mg/dL — ABNORMAL HIGH (ref 0–149)
VLDL Cholesterol Cal: 44 mg/dL — ABNORMAL HIGH (ref 5–40)

## 2020-08-13 LAB — HEMOGLOBIN A1C
Est. average glucose Bld gHb Est-mCnc: 189 mg/dL
Hgb A1c MFr Bld: 8.2 % — ABNORMAL HIGH (ref 4.8–5.6)

## 2020-08-13 LAB — LDL CHOLESTEROL, DIRECT: LDL Direct: 39 mg/dL (ref 0–99)

## 2020-08-16 ENCOUNTER — Encounter: Payer: Self-pay | Admitting: Interventional Cardiology

## 2020-08-16 ENCOUNTER — Ambulatory Visit (INDEPENDENT_AMBULATORY_CARE_PROVIDER_SITE_OTHER): Payer: BC Managed Care – PPO | Admitting: Interventional Cardiology

## 2020-08-16 ENCOUNTER — Other Ambulatory Visit: Payer: Self-pay

## 2020-08-16 VITALS — BP 110/70 | HR 99 | Ht 71.0 in | Wt 207.0 lb

## 2020-08-16 DIAGNOSIS — I252 Old myocardial infarction: Secondary | ICD-10-CM | POA: Diagnosis not present

## 2020-08-16 DIAGNOSIS — E782 Mixed hyperlipidemia: Secondary | ICD-10-CM

## 2020-08-16 DIAGNOSIS — I25118 Atherosclerotic heart disease of native coronary artery with other forms of angina pectoris: Secondary | ICD-10-CM

## 2020-08-16 DIAGNOSIS — Q453 Other congenital malformations of pancreas and pancreatic duct: Secondary | ICD-10-CM

## 2020-08-16 DIAGNOSIS — E1159 Type 2 diabetes mellitus with other circulatory complications: Secondary | ICD-10-CM

## 2020-08-16 DIAGNOSIS — Z794 Long term (current) use of insulin: Secondary | ICD-10-CM

## 2020-08-16 MED ORDER — METOPROLOL SUCCINATE ER 25 MG PO TB24: 25.0000 mg | ORAL_TABLET | Freq: Two times a day (BID) | ORAL | 3 refills | Status: DC

## 2020-08-16 NOTE — Progress Notes (Signed)
Cardiology Office Note   Date:  08/16/2020   ID:  Dan Ford, DOB [birth year], MRN [id number]  PCP:  Shellia Cleverly, PA    No chief complaint on file.  CAD  Wt Readings from Last 3 Encounters:  08/16/20 207 lb (93.9 kg)  07/23/20 203 lb 9.6 oz (92.4 kg)  05/29/20 221 lb (100.2 kg)       History of Present Illness: Dan Ford is a 50 y.o. male  with history of HTN, T2DM on insulin, severe hypertriglyceridemia, nonobstructive CAD, obesity, anxiety disorder who had acute inferior STEMI treated with PCI in 05/01/20.   Cath showed: "Dist RCA lesion is 100% stenosed. A drug-eluting stent was successfully placed using a STENT RESOLUTE ONYX 2.5X18, postdilated to 3.0 mm in the proximal stent. Post intervention, there is a 0% residual stenosis. Ramus lesion is 25% stenosed. Large ramus and small circumflex. Dist LAD lesion is 25% stenosed. Distal LAD tapers diffusely. The left ventricular systolic function is normal. The left ventricular ejection fraction is 55-65% by visual estimate. Basal inferior hypokinesis. LV end diastolic pressure is normal. LVEDP 11 mm Hg. There is no aortic valve stenosis. A drug-eluting stent was successfully placed using a STENT RESOLUTE ONYX 2.5X18.   Continue aggressive secondary prevention with risk factor modification.   Dual antiplatelet therapy for 12 months.  High dose statin.  Will refer to our pharmD lipid clinic to see what  Other therapies or trials may be helpful for TG lowering. "  He has maintained weight loss. He has improved his tolerance. He had another episode of pancreatitis in 07/27/20.  Ozempic was stopped.      Past Medical History:  Diagnosis Date   Coronary artery disease    Diabetes mellitus without complication (HCC)    Family history of adverse reaction to anesthesia    Hyperlipidemia    Hypertension     Past Surgical History:  Procedure Laterality Date   APPENDECTOMY     ARTHOSCOPIC ROTAOR CUFF REPAIR      CHOLECYSTECTOMY     CORONARY/GRAFT ACUTE MI REVASCULARIZATION N/A 05/01/2020   Procedure: Coronary/Graft Acute MI Revascularization;  Surgeon: Corky Crafts, MD;  Location: [facility] INVASIVE CV LAB;  Service: Cardiovascular;  Laterality: N/A;   LEFT HEART CATH AND CORONARY ANGIOGRAPHY N/A 05/01/2020   Procedure: LEFT HEART CATH AND CORONARY ANGIOGRAPHY;  Surgeon: Corky Crafts, MD;  Location: [facility] INVASIVE CV LAB;  Service: Cardiovascular;  Laterality: N/A;     Current Outpatient Medications  Medication Sig Dispense Refill   aspirin EC 81 MG tablet Take 1 tablet (81 mg total) by mouth daily. Swallow whole. 90 tablet 3   dapagliflozin propanediol (FARXIGA) 10 MG TABS tablet Take 1 tablet (10 mg total) by mouth daily. 90 tablet 3   escitalopram (LEXAPRO) 20 MG tablet Take 20 mg by mouth daily.     fenofibrate 160 MG tablet Take 1 tablet (160 mg total) by mouth daily. 90 tablet 3   insulin detemir (LEVEMIR) 100 UNIT/ML injection Inject 0.25 mLs (25 Units total) into the skin daily. 10 mL 11   metoprolol succinate (TOPROL XL) 25 MG 24 hr tablet Take 0.5 tablets (12.5 mg total) by mouth daily. 45 tablet 3   Multiple Vitamins-Minerals (MULTIVITAMIN MEN 50+ PO) Take by mouth.     niacin (NIASPAN) 1000 MG CR tablet Take 2 tablets (2,000 mg total) by mouth at bedtime. 180 tablet 3   nitroGLYCERIN (NITROSTAT) 0.4 MG SL tablet Place 1 tablet (  0.4 mg total) under the tongue every 5 (five) minutes x 3 doses as needed for chest pain. 25 tablet 12   oxyCODONE-acetaminophen (PERCOCET/ROXICET) 5-325 MG tablet Take 1 tablet by mouth every 6 (six) hours as needed for up to 15 doses for severe pain. 15 tablet 0   pantoprazole (PROTONIX) 40 MG tablet Take 40 mg by mouth daily.     rosuvastatin (CRESTOR) 40 MG tablet Take 1 tablet (40 mg total) by mouth daily. 90 tablet 3   ticagrelor (BRILINTA) 90 MG TABS tablet Take 1 tablet (90 mg total) by mouth 2 (two) times daily. 180 tablet 3   VASCEPA 1 g capsule Take  2 g by mouth 2 (two) times daily.     Vitamin D, Ergocalciferol, (DRISDOL) 1.25 MG (50000 UNIT) CAPS capsule Take 50,000 Units by mouth every 7 (seven) days.     zaleplon (SONATA) 10 MG capsule Take 10 mg by mouth at bedtime as needed for sleep.     No current facility-administered medications for this visit.    Allergies:   Amoxicillin-pot clavulanate, Iodine-131, Semaglutide, and Amoxicillin    Social History:  The patient  reports that he has quit smoking. He has never used smokeless tobacco. He reports that he does not drink alcohol and does not use drugs.   Family History:  The patient's family history includes Arrhythmia in his mother; Atrial fibrillation in his mother.    ROS:  Please see the history of present illness.   Otherwise, review of systems are positive for maintaining weight loss.   All other systems are reviewed and negative.    PHYSICAL EXAM: VS:  BP 110/70   Pulse 99   Ht 5\' 11"  (1.803 m)   Wt 207 lb (93.9 kg)   SpO2 97%   BMI 28.87 kg/m  , BMI Body mass index is 28.87 kg/m. GEN: Well nourished, well developed, in no acute distress HEENT: normal Neck: no JVD, carotid bruits, or masses Cardiac: RRR; no murmurs, rubs, or gallops,no edema  Respiratory:  clear to auscultation bilaterally, normal work of breathing GI: soft, nontender, nondistended, + BS MS: no deformity or atrophy Skin: warm and dry, no rash Neuro:  Strength and sensation are intact Psych: euthymic mood, full affect   Recent Labs: 05/03/2020: B Natriuretic Peptide 52.6 07/23/2020: BUN <5; Creatinine, Ser 0.58; Hemoglobin 11.7; Platelets 197; Potassium 3.7; Sodium 136 08/13/2020: ALT 28   Lipid Panel    Component Value Date/Time   CHOL 119 08/13/2020 0759   TRIG 287 (H) 08/13/2020 0759   HDL 32 (L) 08/13/2020 0759   CHOLHDL 3.7 08/13/2020 0759   CHOLHDL NOT REPORTED DUE TO HIGH TRIGLYCERIDES 05/02/2020 0039   VLDL UNABLE TO CALCULATE IF TRIGLYCERIDE OVER 400 mg/dL 07/02/2020 54/56/2563    LDLCALC 43 08/13/2020 0759   LDLDIRECT 39 08/13/2020 0759   LDLDIRECT <10 05/02/2020 0039     Other studies Reviewed: Additional studies/ records that were reviewed today with results demonstrating: labs reviewed.   ASSESSMENT AND PLAN:  CAD: No angina. Continue aggressive secondary prevention. High HR- increase water intake.  Increase metoprolol to 25 mg BID.  DM: A1C 8.2 in June 2022. July 2022 may be causing some fluid loss.  Hyperlipidemia: LDL 43, TG down to 287.  Plant based diet.  High fiber diet. White meat typically. Avoids red meat.  Recurrent pancreatitis:  Needs to keep TG down.     Current medicines are reviewed at length with the patient today.  The patient concerns regarding his  medicines were addressed.  The following changes have been made:  No change  Labs/ tests ordered today include:  No orders of the defined types were placed in this encounter.   Recommend 150 minutes/week of aerobic exercise Low fat, low carb, high fiber diet recommended  Disposition:   FU in 6 months   Signed, Lance Muss, MD  08/16/2020 4:47 PM    [facility] [address] [place], [place], [place]  [address] Phone: [phone]; Fax: [phone]

## 2020-08-16 NOTE — Patient Instructions (Addendum)
Medication Instructions:  Your physician has recommended you make the following change in your medication: Increase metoprolol succinate to 25 mg by mouth twice daily  *If you need a refill on your cardiac medications before your next appointment, please call your pharmacy*   Lab Work: Your physician recommends that you return for lab work on August 29,2022.  Lipid and liver profiles.  This will be fasting.  The lab opens at 7:30 AM  If you have labs (blood work) drawn today and your tests are completely normal, you will receive your results only by: MyChart Message (if you have MyChart) OR A paper copy in the mail If you have any lab test that is abnormal or we need to change your treatment, we will call you to review the results.   Testing/Procedures: none   Follow-Up: At [facility], you and your health needs are our priority.  As part of our continuing mission to provide you with exceptional heart care, we have created designated Provider Care Teams.  These Care Teams include your primary Cardiologist (physician) and Advanced Practice Providers (APPs -  Physician Assistants and Nurse Practitioners) who all work together to provide you with the care you need, when you need it.  We recommend signing up for the patient portal called "MyChart".  Sign up information is provided on this After Visit Summary.  MyChart is used to connect with patients for Virtual Visits (Telemedicine).  Patients are able to view lab/test results, encounter notes, upcoming appointments, etc.  Non-urgent messages can be sent to your provider as well.   To learn more about what you can do with MyChart, go to [email or web address].    Your next appointment:   6 month(s)  The format for your next appointment:   In Person  Provider:   You may see Lance Muss, MD or one of the following Advanced Practice Providers on your designated Care Team:   Ronie Spies, PA-C Jacolyn Reedy, PA-C   Other  Instructions

## 2020-08-28 ENCOUNTER — Ambulatory Visit: Payer: BC Managed Care – PPO | Admitting: Endocrinology

## 2020-10-29 ENCOUNTER — Other Ambulatory Visit: Payer: BC Managed Care – PPO

## 2021-02-26 ENCOUNTER — Encounter: Payer: Self-pay | Admitting: Interventional Cardiology

## 2021-03-04 ENCOUNTER — Encounter: Payer: Self-pay | Admitting: Interventional Cardiology

## 2021-03-05 NOTE — Telephone Encounter (Signed)
OK to call in Paxlovid if sx started less than 5 days ago.  He should hold Crestor for 7 days if he takes the Paxlovid.   JV

## 2021-03-06 MED ORDER — MOLNUPIRAVIR EUA 200MG CAPSULE: 4.0000 | ORAL_CAPSULE | Freq: Two times a day (BID) | ORAL | 0 refills | Status: DC

## 2021-03-06 MED ORDER — MOLNUPIRAVIR EUA 200MG CAPSULE: 4.0000 | ORAL_CAPSULE | Freq: Two times a day (BID) | ORAL | 0 refills | Status: AC

## 2021-03-06 NOTE — Telephone Encounter (Signed)
Reviewed with Margaretmary Dys, PharmD and Molnupiravir recommended in place of Paxlovid due to interactions with patient's medications and Paxlovid.  Dose is Molnupiravir 800 mg every 12 hours for 5 days. Per Dr Eldridge Dace OK to prescribe this.  Prescription sent to [facility]

## 2021-03-11 ENCOUNTER — Ambulatory Visit: Payer: BC Managed Care – PPO | Admitting: Interventional Cardiology

## 2021-03-24 NOTE — Progress Notes (Signed)
Cardiology Office Note   Date:  03/25/2021   ID:  Dan Ford, Dan Ford 06-May-1970, MRN [id number]  PCP:  Manfred Shirts, PA    Chief Complaint  Patient presents with   Follow-up   CAD  Wt Readings from Last 3 Encounters:  03/25/21 228 lb (103.4 kg)  08/16/20 207 lb (93.9 kg)  07/23/20 203 lb 9.6 oz (92.4 kg)       History of Present Illness: Dan Ford is a 51 y.o. male   with history of HTN, T2DM on insulin, severe hypertriglyceridemia, nonobstructive CAD, obesity, anxiety disorder who had acute inferior STEMI treated with PCI in 05/01/20.   Cath showed: "Dist RCA lesion is 100% stenosed. A drug-eluting stent was successfully placed using a STENT RESOLUTE ONYX 2.5X18, postdilated to 3.0 mm in the proximal stent. Post intervention, there is a 0% residual stenosis. Ramus lesion is 25% stenosed. Large ramus and small circumflex. Dist LAD lesion is 25% stenosed. Distal LAD tapers diffusely. The left ventricular systolic function is normal. The left ventricular ejection fraction is 55-65% by visual estimate. Basal inferior hypokinesis. LV end diastolic pressure is normal. LVEDP 11 mm Hg. There is no aortic valve stenosis. A drug-eluting stent was successfully placed using a STENT RESOLUTE ONYX 2.5X18.   Continue aggressive secondary prevention with risk factor modification.   Dual antiplatelet therapy for 12 months.  High dose statin.  Will refer to our pharmD lipid clinic to see what  Other therapies or trials may be helpful for TG lowering. "  Echo in 05/2020: "Left ventricular ejection fraction, by estimation, is 55 to 60%. The  left ventricle has normal function. The left ventricle demonstrates  regional wall motion abnormalities with basal inferior severe hypokinesis.  There is mild left ventricular  hypertrophy. Left ventricular diastolic parameters are consistent with  Grade I diastolic dysfunction (impaired relaxation).   2. Right ventricular systolic  function is normal. The right ventricular  size is normal. There is normal pulmonary artery systolic pressure. The  estimated right ventricular systolic pressure is 42.3 mmHg.   3. The mitral valve is normal in structure. Trivial mitral valve  regurgitation. No evidence of mitral stenosis.   4. The aortic valve is tricuspid. Aortic valve regurgitation is not  visualized. Mild aortic valve sclerosis is present, with no evidence of  aortic valve stenosis.   5. The inferior vena cava is dilated in size with >50% respiratory  variability, suggesting right atrial pressure of 8 mmHg"   As of 08/2020: "He has maintained weight loss. He has improved his tolerance. He had another episode of pancreatitis in 07/27/20.  Ozempic was stopped.  "   He had COVID in Jan 2023.  We prescribed Molnupiravir.  Was bed bound for 4 days.  Resting HR has been higher since COVID.  He feels some chest pressure since COVID,residual cough, HR increases quickly.   Denies : Chest pain like prior MI. Dizziness. Leg edema. Nitroglycerin use. Orthopnea. Palpitations. Paroxysmal nocturnal dyspnea.  Syncope.     Past Medical History:  Diagnosis Date   Coronary artery disease    Diabetes mellitus without complication ([place])    Family history of adverse reaction to anesthesia    Hyperlipidemia    Hypertension     Past Surgical History:  Procedure Laterality Date   APPENDECTOMY     ARTHOSCOPIC ROTAOR CUFF REPAIR     CHOLECYSTECTOMY     CORONARY/GRAFT ACUTE MI REVASCULARIZATION N/A 05/01/2020   Procedure: Coronary/Graft Acute  MI Revascularization;  Surgeon: Jettie Booze, MD;  Location: [place] CV LAB;  Service: Cardiovascular;  Laterality: N/A;   LEFT HEART CATH AND CORONARY ANGIOGRAPHY N/A 05/01/2020   Procedure: LEFT HEART CATH AND CORONARY ANGIOGRAPHY;  Surgeon: Jettie Booze, MD;  Location: [place] CV LAB;  Service: Cardiovascular;  Laterality: N/A;     Current Outpatient Medications  Medication  Sig Dispense Refill   aspirin EC 81 MG tablet Take 1 tablet (81 mg total) by mouth daily. Swallow whole. 90 tablet 3   clopidogrel (PLAVIX) 75 MG tablet Take 1 tablet (75 mg total) by mouth daily. 90 tablet 3   dapagliflozin propanediol (FARXIGA) 10 MG TABS tablet Take 1 tablet (10 mg total) by mouth daily. 90 tablet 3   escitalopram (LEXAPRO) 20 MG tablet Take 20 mg by mouth daily.     fenofibrate 160 MG tablet Take 1 tablet (160 mg total) by mouth daily. 90 tablet 3   insulin detemir (LEVEMIR) 100 UNIT/ML injection Inject 0.25 mLs (25 Units total) into the skin daily. 10 mL 11   metoprolol succinate (TOPROL-XL) 50 MG 24 hr tablet Take 1 tablet (50 mg total) by mouth daily. Take with or immediately following a meal. 90 tablet 3   Multiple Vitamins-Minerals (MULTIVITAMIN MEN 50+ PO) Take 1 tablet by mouth daily.     niacin (NIASPAN) 1000 MG CR tablet Take 2 tablets (2,000 mg total) by mouth at bedtime. 180 tablet 3   nitroGLYCERIN (NITROSTAT) 0.4 MG SL tablet Place 1 tablet (0.4 mg total) under the tongue every 5 (five) minutes x 3 doses as needed for chest pain. 25 tablet 12   pantoprazole (PROTONIX) 40 MG tablet Take 40 mg by mouth daily.     rosuvastatin (CRESTOR) 40 MG tablet Take 1 tablet (40 mg total) by mouth daily. 90 tablet 3   VASCEPA 1 g capsule Take 2 g by mouth 2 (two) times daily.     Vitamin D, Ergocalciferol, (DRISDOL) 1.25 MG (50000 UNIT) CAPS capsule Take 50,000 Units by mouth every 7 (seven) days.     zaleplon (SONATA) 10 MG capsule Take 10 mg by mouth at bedtime as needed for sleep.     No current facility-administered medications for this visit.    Allergies:   Amoxicillin-pot clavulanate, Iodine-131, Semaglutide, and Amoxicillin    Social History:  The patient  reports that he has quit smoking. He has never used smokeless tobacco. He reports that he does not drink alcohol and does not use drugs.   Family History:  The patient's family history includes Arrhythmia in his  mother; Atrial fibrillation in his mother.    ROS:  Please see the history of present illness.   Otherwise, review of systems are positive for DOE as above.   All other systems are reviewed and negative.    PHYSICAL EXAM: VS:  BP 130/80    Pulse (!) 103    Ht '5\' 10"'  (1.778 m)    Wt 228 lb (103.4 kg)    SpO2 97%    BMI 32.71 kg/m  , BMI Body mass index is 32.71 kg/m. GEN: Well nourished, well developed, in no acute distress HEENT: normal Neck: no JVD, carotid bruits, or masses Cardiac: tachycardia, S1 S2; no murmurs, rubs, or gallops,no edema  Respiratory:  clear to auscultation bilaterally, normal work of breathing GI: soft, nontender, nondistended, + BS MS: no deformity or atrophy Skin: warm and dry, no rash Neuro:  Strength and sensation are intact Psych:  euthymic mood, full affect   EKG:   The ekg ordered today demonstrates sinus tachy   Recent Labs: 05/03/2020: B Natriuretic Peptide 52.6 07/23/2020: BUN <5; Creatinine, Ser 0.58; Hemoglobin 11.7; Platelets 197; Potassium 3.7; Sodium 136 08/13/2020: ALT 28   Lipid Panel    Component Value Date/Time   CHOL 119 08/13/2020 0759   TRIG 287 (H) 08/13/2020 0759   HDL 32 (L) 08/13/2020 0759   CHOLHDL 3.7 08/13/2020 0759   CHOLHDL NOT REPORTED DUE TO HIGH TRIGLYCERIDES 05/02/2020 0039   VLDL UNABLE TO CALCULATE IF TRIGLYCERIDE OVER 400 mg/dL 05/02/2020 0039   LDLCALC 43 08/13/2020 0759   LDLDIRECT 39 08/13/2020 0759   LDLDIRECT <10 05/02/2020 0039     Other studies Reviewed: Additional studies/ records that were reviewed today with results demonstrating: Labs reviewed.   ASSESSMENT AND PLAN:  CAD: No angina on medical therapy.  Continue aggressive secondary prevention.  He will finish his current supply of Brilinta.  Then he will start Plavix 75 mg daily. Palpitations: Sinus tachycardia today.  On exam, he had premature beats which were likely PACs or PVCs.  He could feel these when they were happening.  No need for monitor  at this time.  Hopefully, these episodes will become less frequent as the time from his COVID infection increases. DM: Recheck A1c in April.  Regular exercise as noted below. Hyperlipidemia: Recheck lipids in April.  He has had pancreatitis.  Triglycerides.  Continue Niaspan and rosuvastatin. Recurrent pancreatitis: Keep TG down.    Current medicines are reviewed at length with the patient today.  The patient concerns regarding his medicines were addressed.  The following changes have been made:  No change  Labs/ tests ordered today include: As above  Orders Placed This Encounter  Procedures   Lipid Profile   Comp Met (CMET)   HgB A1c   EKG 12-Lead    Recommend 150 minutes/week of aerobic exercise Low fat, low carb, high fiber diet recommended  Disposition:   FU in 6 months   Signed, Larae Grooms, MD  03/25/2021 12:23 PM    [facility] [place], [place], [place]  [address] Phone: [phone]; Fax: [phone]

## 2021-03-25 ENCOUNTER — Encounter: Payer: Self-pay | Admitting: Interventional Cardiology

## 2021-03-25 ENCOUNTER — Other Ambulatory Visit: Payer: Self-pay

## 2021-03-25 ENCOUNTER — Ambulatory Visit: Payer: BC Managed Care – PPO | Admitting: Interventional Cardiology

## 2021-03-25 VITALS — BP 130/80 | HR 103 | Ht 70.0 in | Wt 228.0 lb

## 2021-03-25 DIAGNOSIS — I252 Old myocardial infarction: Secondary | ICD-10-CM

## 2021-03-25 DIAGNOSIS — E782 Mixed hyperlipidemia: Secondary | ICD-10-CM | POA: Diagnosis not present

## 2021-03-25 DIAGNOSIS — Z794 Long term (current) use of insulin: Secondary | ICD-10-CM

## 2021-03-25 DIAGNOSIS — E1159 Type 2 diabetes mellitus with other circulatory complications: Secondary | ICD-10-CM | POA: Diagnosis not present

## 2021-03-25 DIAGNOSIS — I25118 Atherosclerotic heart disease of native coronary artery with other forms of angina pectoris: Secondary | ICD-10-CM

## 2021-03-25 MED ORDER — CLOPIDOGREL BISULFATE 75 MG PO TABS: 75.0000 mg | ORAL_TABLET | Freq: Every day | ORAL | 3 refills | Status: DC

## 2021-03-25 MED ORDER — METOPROLOL SUCCINATE ER 50 MG PO TB24: 50.0000 mg | ORAL_TABLET | Freq: Every day | ORAL | 3 refills | Status: DC

## 2021-03-25 NOTE — Patient Instructions (Signed)
Medication Instructions:  Your physician has recommended you make the following change in your medication: Doreatha Martin current supply of Brilinta.  When finished start Clopidogrel 75 mg by mouth daily. Change metoprolol to Metoprolol Succinate 50 mg by mouth daily  *If you need a refill on your cardiac medications before your next appointment, please call your pharmacy*   Lab Work: Your physician recommends that you return for lab work on June 21, 2021.  Lipids, CMET, A1C.  This will be fasting.  The lab opens at 7:30 AM  If you have labs (blood work) drawn today and your tests are completely normal, you will receive your results only by: MyChart Message (if you have MyChart) OR A paper copy in the mail If you have any lab test that is abnormal or we need to change your treatment, we will call you to review the results.   Testing/Procedures: none   Follow-Up: At [facility], you and your health needs are our priority.  As part of our continuing mission to provide you with exceptional heart care, we have created designated Provider Care Teams.  These Care Teams include your primary Cardiologist (physician) and Advanced Practice Providers (APPs -  Physician Assistants and Nurse Practitioners) who all work together to provide you with the care you need, when you need it.  We recommend signing up for the patient portal called "MyChart".  Sign up information is provided on this After Visit Summary.  MyChart is used to connect with patients for Virtual Visits (Telemedicine).  Patients are able to view lab/test results, encounter notes, upcoming appointments, etc.  Non-urgent messages can be sent to your provider as well.   To learn more about what you can do with MyChart, go to [email or web address].    Your next appointment:   6 month(s)  The format for your next appointment:   In Person  Provider:   Lance Muss, MD     Other Instructions

## 2021-05-31 ENCOUNTER — Other Ambulatory Visit: Payer: Self-pay | Admitting: Interventional Cardiology

## 2021-06-21 ENCOUNTER — Other Ambulatory Visit: Payer: BC Managed Care – PPO

## 2021-06-24 ENCOUNTER — Other Ambulatory Visit: Payer: Self-pay | Admitting: Interventional Cardiology

## 2021-07-02 ENCOUNTER — Other Ambulatory Visit: Payer: Self-pay | Admitting: Interventional Cardiology

## 2021-07-02 NOTE — Telephone Encounter (Signed)
OK to refill

## 2021-07-03 ENCOUNTER — Other Ambulatory Visit: Payer: Self-pay | Admitting: Interventional Cardiology

## 2021-08-27 ENCOUNTER — Encounter: Payer: Self-pay | Admitting: Interventional Cardiology

## 2021-08-28 MED ORDER — NITROGLYCERIN 0.4 MG SL SUBL: 0.4000 mg | SUBLINGUAL_TABLET | SUBLINGUAL | 5 refills | Status: AC | PRN

## 2021-09-02 ENCOUNTER — Other Ambulatory Visit: Payer: Self-pay | Admitting: Interventional Cardiology

## 2021-09-06 ENCOUNTER — Telehealth: Payer: Self-pay | Admitting: *Deleted

## 2021-09-06 NOTE — Telephone Encounter (Signed)
   Pre-operative Risk Assessment    Patient Name: Dan Ford  DOB: Jul 12, 1970 MRN: [id number]      Request for Surgical Clearance    Procedure:   RIGHT SHOULDER SCOPE, ROTATOR CUFF REPAIR  Date of Surgery:  Clearance 10/30/21                                 Surgeon:  DR. Ramond Marrow Surgeon's Group or Practice Name:  [facility] Phone number:  [phone] EXT [phone] ATTN: Alfonso Ramus Fax number:  [phone]   Type of Clearance Requested:   - Medical  - Pharmacy:  Hold Clopidogrel (Plavix)     Type of Anesthesia:  General    Additional requests/questions:    Elpidio Anis   09/06/2021, 5:43 PM

## 2021-09-09 NOTE — Telephone Encounter (Signed)
Primary Cardiologist:Jayadeep Eldridge Dace, MD  Chart reviewed as part of pre-operative protocol coverage. Because of Dan Ford's past medical history and time since last visit, he/she will require a follow-up visit in order to better assess preoperative cardiovascular risk.  Pre-op covering staff: - Please schedule appointment and call patient to inform them. - Please contact requesting surgeon's office via preferred method (i.e, phone, fax) to inform them of need for appointment prior to surgery.  If applicable, this message will also be routed to pharmacy pool and/or primary cardiologist for input on holding anticoagulant/antiplatelet agent as requested below so that this information is available at time of patient's appointment.   Levi Aland, NP-C    09/09/2021, 9:14 AM [facility] [address], [address] Office [phone] Fax [phone]

## 2021-09-10 NOTE — Telephone Encounter (Signed)
Tried to reach the pt to schedule an IN OFFICE apppt, though vm is full could not leave a message. I will send FYI to requesting office, in hopes they may s/w the pt. If to please let the pt know he needs to call his cardiologist office for IN OFFICE appt for pre op clearance.

## 2021-09-11 NOTE — Telephone Encounter (Signed)
Pt has been scheduled to see Tereso Newcomer, PA-C, 09/16/21, clearance will be addressed at that time.  Will route back to the requesting surgeon's office to make them aware.

## 2021-09-15 NOTE — Progress Notes (Addendum)
Cardiology Office Note:    Date:  09/16/2021   ID:  Dan Ford, DOB 02/19/1971, MRN [id number]  PCP:  Shellia Cleverly, PA   [facility] Providers Cardiologist:  Lance Muss, MD     Referring MD: Shellia Cleverly, [place]   No chief complaint on file. CC: Palpitations  History of Present Illness:    Dan Ford is a 51 y.o. male with a hx of  CAD Inferior STEMI (s/p DES to distal RCA) on 05/01/20 >>Grade 1 diastolic dysfunction, mild LVH on Echo 05/02/20, LVEF of 55-60%, trivial MVR, mild aortic valve sclerosis, dilated IVC, suggesting right atrial pressure of 8 mmHg. Celiac artery stenosis Hypotension d/t hypovolemia HTN OSA HLD >>Hx of recurrent pancreatitis due to high triglyceride count  T2DM Obesity Adjustment disorder with anxiety  He was last seen in our office on 03/25/21 for coronary artery disease follow-up. He was treated for COVID-19 during 03/2021 and stated his resting HR had been higher since COVID, said he felt some chest pressure and had a residual cough since COVID. Stated he felt like his heart rate increased quickly. Was found to be in sinus tachycardia. On exam, he had premature beats most likely PACs or PVCs. It was recommended that pt did not need monitor at that time. Pt was also educated about lifestyle recommendations. He was also told to finish current supply of Brilinta and then start Plavix 75 mg PO daily. States he is taking an Aspirin 81 mg PO daily.   He was seen in the [facility] ED ([facility]) on 05/03/2021 for a CC of extremity weakness/numbness/tingling and facial droop (left sided). He had CP and SHOB with these symptoms. CP was exacerbated by deep inspiration and palpating his chest wall. Blood sugar with EMS was 585. Hyponatremia - Sodium at 118, Glucose 571, BNP 360. No fever, pulse 127, BP 156/85, all other labs normal. UA showed glucose, ketones, and LE. Admitted to the hospital, given IV insulin/IV Ativan and NS bolus and  switched to NS infusion. Triglyceride level 2,415, Lipase at 135. Pt had abdominal pain. CT scan did not reveal any acute findings; however few small calcifications seen within posterior head of pancrase, which may reflect chronic pancreatitis. While on the insulin dttp, triglycerides trended down. Neurology consult for stroke concerns. CT scan and MRI of Brain did not reveal any acute findings for stroke or TIA. ASA and Brilinta continued. He clinincally improved and was d/c with H@H  follow up.   Today he presents for pre-op clearance for right shoulder scope, rotator cuff repair. His surgery is scheduled for 10/30/2021 and surgeon will be Dr. Ramond Marrow of [facility]. Requesting clearance to hold Plavix. Today he presents with a CC of palpitations that have remained the same since his last Cardiology office visit in January of this year. Says he notices this at rest while sitting down and says he will notice "some extra beats" that do go away. Does not notice these symptoms with exertion and denies any symptoms with exertion. Says occasionally he feels his heart racing but denies any associated symptoms with this. Denies any chest pain, shortness of breath, edema, syncope, presyncope, dizziness, orthopnea, PND, falls, bleeding, or orthostatic dizziness. Denies any recent Nitroglycerin use. Compliant with his CPAP and uses this every night. Eating fruits and vegetables in his diet, walks regularly, and enjoys gardening. Dan Ford he has not been referred to the Lipid Clinic but saw the [facility] in June and was  seen by Endocrinology for his recurrent pancreatitis and they did medical management for him. No other acute cardiac concerns today.    Labs independently reviewed: CMET on 09/10/21  WNL, except Glucose 107 Lipid profile on 09/10/21 WNL except Triglycerides 342, and HDL is 29. LDL is 31.   Hgb A1C on 09/10/21 is 9.4%.  CBC on 07/23/21: WNL except Hgb 11.7, HCT 34.8, and RBC of 4.01.   Past  Medical History:  Diagnosis Date   Coronary artery disease    Diabetes mellitus without complication (HCC)    Family history of adverse reaction to anesthesia    Hyperlipidemia    Hypertension     Past Surgical History:  Procedure Laterality Date   APPENDECTOMY     ARTHOSCOPIC ROTAOR CUFF REPAIR     CHOLECYSTECTOMY     CORONARY/GRAFT ACUTE MI REVASCULARIZATION N/A 05/01/2020   Procedure: Coronary/Graft Acute MI Revascularization;  Surgeon: Corky Crafts, MD;  Location: [facility] INVASIVE CV LAB;  Service: Cardiovascular;  Laterality: N/A;   LEFT HEART CATH AND CORONARY ANGIOGRAPHY N/A 05/01/2020   Procedure: LEFT HEART CATH AND CORONARY ANGIOGRAPHY;  Surgeon: Corky Crafts, MD;  Location: [facility] INVASIVE CV LAB;  Service: Cardiovascular;  Laterality: N/A;    Current Medications: Current Meds  Medication Sig   aspirin EC 81 MG tablet Take 81 mg by mouth daily. Swallow whole.   clopidogrel (PLAVIX) 75 MG tablet Take 1 tablet (75 mg total) by mouth daily.   dapagliflozin propanediol (FARXIGA) 10 MG TABS tablet Take 1 tablet (10 mg total) by mouth daily.   escitalopram (LEXAPRO) 20 MG tablet Take 20 mg by mouth daily.   fenofibrate 160 MG tablet TAKE 1 TABLET BY MOUTH EVERY DAY   insulin aspart protamine- aspart (NOVOLOG MIX 70/30) (70-30) 100 UNIT/ML injection Inject 15 Units into the skin in the morning, at noon, and at bedtime.   insulin detemir (LEVEMIR) 100 UNIT/ML injection Inject 0.25 mLs (25 Units total) into the skin daily.   lisinopril (ZESTRIL) 10 MG tablet Take 10 mg by mouth daily.   metoprolol succinate (TOPROL-XL) 50 MG 24 hr tablet Take 0.5 tablets (25 mg total) by mouth 2 (two) times daily. Please call [phone] to schedule an appointment for future refills. Thank you.   Multiple Vitamins-Minerals (MULTIVITAMIN MEN 50+ PO) Take 1 tablet by mouth daily.   niacin (NIASPAN) 1000 MG CR tablet TAKE 2 TABLETS BY MOUTH AT BEDTIME   nitroGLYCERIN (NITROSTAT) 0.4 MG SL tablet  Place 1 tablet (0.4 mg total) under the tongue every 5 (five) minutes x 3 doses as needed for chest pain.   pantoprazole (PROTONIX) 40 MG tablet Take 40 mg by mouth daily.   rosuvastatin (CRESTOR) 40 MG tablet TAKE 1 TABLET BY MOUTH EVERY DAY FOR CHOLESTEROL   VASCEPA 1 g capsule Take 2 g by mouth 2 (two) times daily.   Vitamin D, Ergocalciferol, (DRISDOL) 1.25 MG (50000 UNIT) CAPS capsule Take 50,000 Units by mouth every 7 (seven) days.   zaleplon (SONATA) 10 MG capsule Take 10 mg by mouth at bedtime as needed for sleep.     Allergies:   Amoxicillin-pot clavulanate, Iodine-131, Semaglutide, and Amoxicillin   Social History   Socioeconomic History   Marital status: Married    Spouse name: Not on file   Number of children: Not on file   Years of education: Not on file   Highest education level: Not on file  Occupational History   Not on file  Tobacco Use  Smoking status: Former   Smokeless tobacco: Never  Building services engineerVaping Use   Vaping Use: Never used  Substance and Sexual Activity   Alcohol use: Never   Drug use: Never   Sexual activity: Not on file  Other Topics Concern   Not on file  Social History Narrative   Not on file   Social Determinants of Health   Financial Resource Strain: Not on file  Food Insecurity: Not on file  Transportation Needs: Not on file  Physical Activity: Not on file  Stress: Not on file  Social Connections: Not on file     Family History: The patient's family history is positive for heart murmur, coronary artery disease, and atrial fibrillation.   ROS:    Review of Systems  Constitutional:  Negative for chills, diaphoresis, fever, malaise/fatigue and weight loss.  HENT:  Negative for congestion, ear discharge, ear pain, hearing loss, nosebleeds, sinus pain, sore throat and tinnitus.   Eyes:  Negative for blurred vision, double vision, photophobia, pain, discharge and redness.  Respiratory:  Negative for cough, hemoptysis, sputum production,  shortness of breath, wheezing and stridor.   Cardiovascular:  Positive for palpitations. Negative for chest pain, orthopnea, claudication, leg swelling and PND.  Gastrointestinal:  Negative for abdominal pain, blood in stool, constipation, diarrhea, heartburn, melena, nausea and vomiting.  Genitourinary:  Negative for dysuria, flank pain, frequency, hematuria and urgency.  Musculoskeletal:  Negative for back pain, falls, joint pain, myalgias and neck pain.  Skin:  Negative for itching and rash.  Neurological:  Negative for dizziness, tingling, tremors, sensory change, speech change, focal weakness, seizures, loss of consciousness, weakness and headaches.  Endo/Heme/Allergies:  Negative for environmental allergies and polydipsia. Does not bruise/bleed easily.  Psychiatric/Behavioral:  Negative for depression, hallucinations, memory loss, substance abuse and suicidal ideas. The patient is not nervous/anxious and does not have insomnia.    Please see the history of present illness.    All other systems reviewed and are negative.  EKGs/Labs/Other Studies Reviewed:    The following studies were reviewed today:   EKG:  EKG is ordered today.  The ekg ordered today demonstrates normal sinus rhythm without any PVC's or PAC's. No acute changes since last EKG.   2D Echo on 05/02/2020: Left ventricular ejection fraction, by estimation, is 55 to 60%. The left ventricle has normal function. The left ventricle demonstrates regional wall motion abnormalities with basal inferior severe hypokinesis. There is mild left ventricular hypertrophy. Left ventricular diastolic parameters are consistent with Grade I diastolic dysfunction (impaired relaxation). 2. Right ventricular systolic function is normal. The right ventricular size is normal. There is normal pulmonary artery systolic pressure. The estimated right ventricular systolic pressure is 14.7 mmHg. 3. The mitral valve is normal in structure. Trivial mitral  valve regurgitation. No evidence of mitral stenosis. 4. The aortic valve is tricuspid. Aortic valve regurgitation is not visualized. Mild aortic valve sclerosis is present, with no evidence of aortic valve stenosis. 5. The inferior vena cava is dilated in size with >50% respiratory variability, suggesting right atrial pressure of 8 mmHg.  LHC and coronary angiography on 05/01/2020: Dist RCA lesion is 100% stenosed. A drug-eluting stent was successfully placed using a STENT RESOLUTE ONYX 2.5X18, postdilated to 3.0 mm in the proximal stent. Post intervention, there is a 0% residual stenosis. Ramus lesion is 25% stenosed. Large ramus and small circumflex. Dist LAD lesion is 25% stenosed. Distal LAD tapers diffusely. The left ventricular systolic function is normal. The left ventricular ejection fraction is 55-65%  by visual estimate. Basal inferior hypokinesis. LV end diastolic pressure is normal. LVEDP 11 mm Hg. There is no aortic valve stenosis. A drug-eluting stent was successfully placed using a STENT RESOLUTE ONYX 2.5X18.   Continue aggressive secondary prevention with risk factor modification.  Finish current bag of aggrastat and then stop.  Watch in ICU.    Dual antiplatelet therapy for 12 months.  High dose statin.  Will refer to our pharmD lipid clinic to see what  Other therapies or trials may be helpful for TG lowering.    Recent Labs: Labs independently reviewed: CMET on 09/10/21  WNL, except Glucose 107 Lipid profile on 09/10/21 WNL except Triglycerides 342, and HDL is 29. LDL is 31.   Hgb A1C on 09/10/21 is 9.4%.  CBC on 07/23/21: WNL except Hgb 11.7, HCT 34.8, and RBC of 4.01.   Recent Lipid Panel    Component Value Date/Time   CHOL 119 08/13/2020 0759   TRIG 287 (H) 08/13/2020 0759   HDL 32 (L) 08/13/2020 0759   CHOLHDL 3.7 08/13/2020 0759   CHOLHDL NOT REPORTED DUE TO HIGH TRIGLYCERIDES 05/02/2020 0039   VLDL UNABLE TO CALCULATE IF TRIGLYCERIDE OVER 400 mg/dL 16/12/9602  5409   LDLCALC 43 08/13/2020 0759   LDLDIRECT 39 08/13/2020 0759   LDLDIRECT <10 05/02/2020 0039     Risk Assessment/Calculations:     RCRI Score is 2. Perioperative risk of major cardiac event is 6.6% DAIS Score is 58.2. Functional capacity in METS is 9.89.      { Recommendations:  Antiplatelet and/or Anticoagulation Recommendations: Clopidogrel (Plavix) can be held for 5 days prior to his surgery and resumed as soon as possible post op. From a cardiac standpoint, we would prefer patient to continue Aspirin however if patient is a bleeding risk, may hold Aspirin for 5-7 days prior to surgery.   Physical Exam:    VS:  BP 132/72   Pulse 72   Ht  (1.778 m)   Wt 232 lb (105.2 kg)   SpO2 93%   BMI 33.29 kg/m     Wt Readings from Last 3 Encounters:  09/16/21 232 lb (105.2 kg)  03/25/21 228 lb (103.4 kg)  08/16/20 207 lb (93.9 kg)     GEN: Well nourished, well developed in no acute distress HEENT: Normal NECK: No JVD; No carotid bruits CARDIAC: RRR, no murmurs, rubs, gallops RESPIRATORY:  Clear to auscultation without rales, wheezing or rhonchi  ABDOMEN: Soft, non-tender, bowel sounds X 4 MUSCULOSKELETAL:  No edema; No deformity  SKIN: Warm and dry, scattered healed abrasions along bilateral lower legs NEUROLOGIC:  Alert and oriented x 3 PSYCHIATRIC:  Normal affect   ASSESSMENT:    1. Encounter for pre-operative cardiovascular clearance   2. Coronary artery disease involving native coronary artery of native heart without angina pectoris   3. Essential hypertension, benign   4. Hyperlipidemia, unspecified hyperlipidemia type   5. Hypertriglyceridemia   6. Type 2 diabetes mellitus with hyperglycemia, without long-term current use of insulin (HCC)   7. Obesity, unspecified classification, unspecified obesity type, unspecified whether serious comorbidity present   8. OSA (obstructive sleep apnea)   9. Palpitations   10. High triglycerides    PLAN:    In order  of problems listed above:  Encounter for pre-operative cardiovascular clearance - stable RCRI score is 2. Perioperative risk of major cardiac event is 6.6% and DAIS is 58.2 and functional capacity in METS is 9.89. Will order a 14 day Zio for patient's  palpitations as mentioned below in number 2. If this comes back normal, patient may proceed at an acceptable risk. Clopidogrel (Plavix) can be held for 5 days prior to his surgery and resumed as soon as possible post op. From a cardiac standpoint, we would prefer patient to continue Aspirin perioperatively however if patient is a bleeding risk, may hold Aspirin for 5-7 days prior to surgery. Will fax over this note.   Addendum on 10/17/2021: 14 day monitor did not show any significant arrhthymias; he is okay to proceed at an acceptable risk.   Palpitations - not progressing, chronic Overall patient continues to have the same palpitations as he experienced back in January. Denies any other acute cardiac symptoms. Previously, Dr. Sedonia Small deferred a cardiac monitor as this was thought to be related to his past Covid-19 infection and thought this would improve over time. Most previous blood work is stable. Since he is still experiencing this, we will go ahead and arrange a 14 day Zio monitor for him. We will see what this monitor shows and if there are no acute findings, patient can proceed with his surgery at an acceptable risk.  Addendum on 10/17/2021: 14 day monitor did not show any significant arrhthymias; he is okay to proceed at an acceptable risk.   CAD s/p STEMI with PCI (DES to distal RCA) on 05/01/20 - chronic, stable Stable with no anginal symptoms. No indication for ischemic evaluation. Clopidogrel (Plavix) can be held for 5 days prior to his surgery and resumed as soon as possible post op. From a cardiac standpoint, we would prefer patient to continue Aspirin perioperatively however if patient is a bleeding risk, may hold Aspirin for 5-7 days prior to  surgery. Continue current medication regimen.    HTN - chronic, stable Blood pressure today is stable at 132/72 and blood pressures at home remain in the 130s according to patient. He is on lisinopril 10 mg PO daily and Metoprolol 50 mg PO daily and is tolerating this well. I told him BP goal is <130/80. We will do lifestyle modifications for now. Heart healthy diet and regular cardiovascular exercise encouraged. Discussed to monitor BP at home at least 2 hours after medications and sitting for 5-10 minutes. Told him to send a Mychart message with BP log in 2 weeks. Will continue to monitor.     HLD, Hypertriglyceridemia - chronic, progressing Lipid panel is improved; however TG level is still elevated at 342 on 09/10/21. It appears that Cardiology wanted to refer him to the Lipid Clinic previously; however, he has not seen them. I will refer him to the Lipid clinic today. Continue current medication management as listed above. Educated him about ways to lower cholesterol and triglycerides in his diet. He verbalizes understanding. Heart healthy diet and regular cardiovascular exercise encouraged.    T2DM - chronic, stable Last HgbA1C was 9.4%. Continue current diabetes medical management. PCP to manage. Heart healthy diet and regular cardiovascular exercise encouraged. Continue to follow with PCP.   Obesity - chronic, stable Weight today is 232 lbs and previous weight in January was 228 lbs. Heart healthy diet and regular cardiovascular exercise encouraged.   OSA - chronic, stable Continue CPAP therapy at night. Patient is compliant with this. Will continue to monitor.   9. Disposition: Follow up with an APP or Cardiologist in 6 months or sooner if needed.      Medication Adjustments/Labs and Tests Ordered: Current medicines are reviewed at length with the patient today.  Concerns regarding  medicines are outlined above.  Orders Placed This Encounter  Procedures   AMB Referral to Advanced  Lipid Disorders Clinic   LONG TERM MONITOR (3-14 DAYS)   EKG 12-Lead   No orders of the defined types were placed in this encounter.   Patient Instructions  Medication Instructions:  Your physician recommends that you continue on your current medications as directed. Please refer to the Current Medication list given to you today.  *If you need a refill on your cardiac medications before your next appointment, please call your pharmacy*   Lab Work: None ordered  If you have labs (blood work) drawn today and your tests are completely normal, you will receive your results only by: MyChart Message (if you have MyChart) OR A paper copy in the mail If you have any lab test that is abnormal or we need to change your treatment, we will call you to review the results.   Testing/Procedures: Christena Deem- Long Term Monitor Instructions  Your physician has requested you wear a ZIO patch monitor for 14 days.  This is a single patch monitor. [employer] supplies one patch monitor per enrollment. Additional stickers are not available. Please do not apply patch if you will be having a Nuclear Stress Test,  Echocardiogram, Cardiac CT, MRI, or Chest Xray during the period you would be wearing the  monitor. The patch cannot be worn during these tests. You cannot remove and re-apply the  ZIO XT patch monitor.  Your ZIO patch monitor will be mailed 3 day USPS to your address on file. It may take 3-5 days  to receive your monitor after you have been enrolled.  Once you have received your monitor, please review the enclosed instructions. Your monitor  has already been registered assigning a specific monitor serial # to you.  Billing and Patient Assistance Program Information  We have supplied [employer] with any of your insurance information on file for billing purposes. [employer] offers a sliding scale Patient Assistance Program for patients that do not have  insurance, or whose insurance does not completely  cover the cost of the ZIO monitor.  You must apply for the Patient Assistance Program to qualify for this discounted rate.  To apply, please call [employer] at [phone], select option 4, select option 2, ask to apply for  Patient Assistance Program. Meredeth Ide will ask your household income, and how many people  are in your household. They will quote your out-of-pocket cost based on that information.  [employer] will also be able to set up a 28-month, interest-free payment plan if needed.  Applying the monitor   Shave hair from upper left chest.  Hold abrader disc by orange tab. Rub abrader in 40 strokes over the upper left chest as  indicated in your monitor instructions.  Clean area with 4 enclosed alcohol pads. Let dry.  Apply patch as indicated in monitor instructions. Patch will be placed under collarbone on left  side of chest with arrow pointing upward.  Rub patch adhesive wings for 2 minutes. Remove white label marked "1". Remove the white  label marked "2". Rub patch adhesive wings for 2 additional minutes.  While looking in a mirror, press and release button in center of patch. A small green light will  flash 3-4 times. This will be your only indicator that the monitor has been turned on.  Do not shower for the first 24 hours. You may shower after the first 24 hours.  Press the button if you feel a  symptom. You will hear a small click. Record Date, Time and  Symptom in the Patient Logbook.  When you are ready to remove the patch, follow instructions on the last 2 pages of Patient  Logbook. Stick patch monitor onto the last page of Patient Logbook.  Place Patient Logbook in the blue and white box. Use locking tab on box and tape box closed  securely. The blue and white box has prepaid postage on it. Please place it in the mailbox as  soon as possible. Your physician should have your test results approximately 7 days after the  monitor has been mailed back to [facility].  Call [facility] Customer Care at [phone] if you have questions regarding  your ZIO XT patch monitor. Call them immediately if you see an orange light blinking on your  monitor.  If your monitor falls off in less than 4 days, contact our Monitor department at [phone].  If your monitor becomes loose or falls off after 4 days call [employer] at [phone] for  suggestions on securing your monitor     You have been referred to Lipid Clinic for Triglycerides management   Follow-Up: At [facility], you and your health needs are our priority.  As part of our continuing mission to provide you with exceptional heart care, we have created designated Provider Care Teams.  These Care Teams include your primary Cardiologist (physician) and Advanced Practice Providers (APPs -  Physician Assistants and Nurse Practitioners) who all work together to provide you with the care you need, when you need it.  We recommend signing up for the patient portal called "MyChart".  Sign up information is provided on this After Visit Summary.  MyChart is used to connect with patients for Virtual Visits (Telemedicine).  Patients are able to view lab/test results, encounter notes, upcoming appointments, etc.  Non-urgent messages can be sent to your provider as well.   To learn more about what you can do with MyChart, go to [email or web address].    Your next appointment:   6 month(s)  The format for your next appointment:   In Person  Provider:   Lance Muss, MD     Other Instructions Your physician has requested that you regularly monitor and record your blood pressure readings at home. Please use the same machine at the same time of day to check your readings and record them to bring to your follow-up visit.   Please monitor blood pressures and keep a log of your readings and in 2 weeks, send in a mychart message with those.    Make sure to check 2 hours after your medications.    AVOID these  things for 30 minutes before checking your blood pressure: No Drinking caffeine. No Drinking alcohol. No Eating. No Smoking. No Exercising.   Five minutes before checking your blood pressure: Pee. Sit in a dining chair. Avoid sitting in a soft couch or armchair. Be quiet. Do not talk   Important Information About Sugar         Signed, Sharlene Dory, NP  09/16/2021 9:57 AM    [facility]

## 2021-09-16 ENCOUNTER — Ambulatory Visit (INDEPENDENT_AMBULATORY_CARE_PROVIDER_SITE_OTHER): Payer: BC Managed Care – PPO

## 2021-09-16 ENCOUNTER — Encounter: Payer: Self-pay | Admitting: Nurse Practitioner

## 2021-09-16 ENCOUNTER — Ambulatory Visit (INDEPENDENT_AMBULATORY_CARE_PROVIDER_SITE_OTHER): Payer: BC Managed Care – PPO | Admitting: Nurse Practitioner

## 2021-09-16 VITALS — BP 132/72 | HR 72 | Ht 70.0 in | Wt 232.0 lb

## 2021-09-16 DIAGNOSIS — R002 Palpitations: Secondary | ICD-10-CM

## 2021-09-16 DIAGNOSIS — Z01810 Encounter for preprocedural cardiovascular examination: Secondary | ICD-10-CM | POA: Diagnosis not present

## 2021-09-16 DIAGNOSIS — E785 Hyperlipidemia, unspecified: Secondary | ICD-10-CM

## 2021-09-16 DIAGNOSIS — I10 Essential (primary) hypertension: Secondary | ICD-10-CM

## 2021-09-16 DIAGNOSIS — E669 Obesity, unspecified: Secondary | ICD-10-CM

## 2021-09-16 DIAGNOSIS — E781 Pure hyperglyceridemia: Secondary | ICD-10-CM

## 2021-09-16 DIAGNOSIS — I2510 Atherosclerotic heart disease of native coronary artery without angina pectoris: Secondary | ICD-10-CM | POA: Diagnosis not present

## 2021-09-16 DIAGNOSIS — G4733 Obstructive sleep apnea (adult) (pediatric): Secondary | ICD-10-CM

## 2021-09-16 DIAGNOSIS — E1165 Type 2 diabetes mellitus with hyperglycemia: Secondary | ICD-10-CM

## 2021-09-16 NOTE — Patient Instructions (Addendum)
Medication Instructions:  Your physician recommends that you continue on your current medications as directed. Please refer to the Current Medication list given to you today.  *If you need a refill on your cardiac medications before your next appointment, please call your pharmacy*   Lab Work: None ordered  If you have labs (blood work) drawn today and your tests are completely normal, you will receive your results only by: [place] (if you have MyChart) OR A paper copy in the mail If you have any lab test that is abnormal or we need to change your treatment, we will call you to review the results.   Testing/Procedures: [place]- Long Term Monitor Instructions  Your physician has requested you wear a ZIO patch monitor for 14 days.  This is a single patch monitor. [employer] supplies one patch monitor per enrollment. Additional stickers are not available. Please do not apply patch if you will be having a Nuclear Stress Test,  Echocardiogram, Cardiac CT, MRI, or Chest Xray during the period you would be wearing the  monitor. The patch cannot be worn during these tests. You cannot remove and re-apply the  ZIO XT patch monitor.  Your ZIO patch monitor will be mailed 3 day USPS to your address on file. It may take 3-5 days  to receive your monitor after you have been enrolled.  Once you have received your monitor, please review the enclosed instructions. Your monitor  has already been registered assigning a specific monitor serial # to you.  Billing and Patient Assistance Program Information  We have supplied [employer] with any of your insurance information on file for billing purposes. [employer] offers a sliding scale Patient Assistance Program for patients that do not have  insurance, or whose insurance does not completely cover the cost of the ZIO monitor.  You must apply for the Patient Assistance Program to qualify for this discounted rate.  To apply, please call [employer] at  [phone], select option 4, select option 2, ask to apply for  Patient Assistance Program. Theodore Demark will ask your household income, and how many people  are in your household. They will quote your out-of-pocket cost based on that information.  [employer] will also be able to set up a 48-month interest-free payment plan if needed.  Applying the monitor   Shave hair from upper left chest.  Hold abrader disc by orange tab. Rub abrader in 40 strokes over the upper left chest as  indicated in your monitor instructions.  Clean area with 4 enclosed alcohol pads. Let dry.  Apply patch as indicated in monitor instructions. Patch will be placed under collarbone on left  side of chest with arrow pointing upward.  Rub patch adhesive wings for 2 minutes. Remove white label marked "1". Remove the white  label marked "2". Rub patch adhesive wings for 2 additional minutes.  While looking in a mirror, press and release button in center of patch. A small green light will  flash 3-4 times. This will be your only indicator that the monitor has been turned on.  Do not shower for the first 24 hours. You may shower after the first 24 hours.  Press the button if you feel a symptom. You will hear a small click. Record Date, Time and  Symptom in the Patient Logbook.  When you are ready to remove the patch, follow instructions on the last 2 pages of Patient  Logbook. Stick patch monitor onto the last page of Patient Logbook.  Place Patient  Logbook in the blue and white box. Use locking tab on box and tape box closed  securely. The blue and white box has prepaid postage on it. Please place it in the mailbox as  soon as possible. Your physician should have your test results approximately 7 days after the  monitor has been mailed back to [facility].  Call [facility] Customer Care at [phone] if you have questions regarding  your ZIO XT patch monitor. Call them immediately if you see an orange light  blinking on your  monitor.  If your monitor falls off in less than 4 days, contact our Monitor department at [phone].  If your monitor becomes loose or falls off after 4 days call [employer] at [phone] for  suggestions on securing your monitor     You have been referred to Lipid Clinic for Triglycerides management   Follow-Up: At [facility], you and your health needs are our priority.  As part of our continuing mission to provide you with exceptional heart care, we have created designated Provider Care Teams.  These Care Teams include your primary Cardiologist (physician) and Advanced Practice Providers (APPs -  Physician Assistants and Nurse Practitioners) who all work together to provide you with the care you need, when you need it.  We recommend signing up for the patient portal called "MyChart".  Sign up information is provided on this After Visit Summary.  MyChart is used to connect with patients for Virtual Visits (Telemedicine).  Patients are able to view lab/test results, encounter notes, upcoming appointments, etc.  Non-urgent messages can be sent to your provider as well.   To learn more about what you can do with MyChart, go to [email or web address].    Your next appointment:   6 month(s)  The format for your next appointment:   In Person  Provider:   Lance Muss, MD     Other Instructions Your physician has requested that you regularly monitor and record your blood pressure readings at home. Please use the same machine at the same time of day to check your readings and record them to bring to your follow-up visit.   Please monitor blood pressures and keep a log of your readings and in 2 weeks, send in a mychart message with those.    Make sure to check 2 hours after your medications.    AVOID these things for 30 minutes before checking your blood pressure: No Drinking caffeine. No Drinking alcohol. No Eating. No Smoking. No Exercising.   Five  minutes before checking your blood pressure: Pee. Sit in a dining chair. Avoid sitting in a soft couch or armchair. Be quiet. Do not talk   Important Information About Sugar

## 2021-09-16 NOTE — Progress Notes (Unsigned)
Enrolled for [employer] to mail a ZIO XT long term holter monitor to the patients address on file.   Dr. Varanasi to read. 

## 2021-09-19 DIAGNOSIS — R002 Palpitations: Secondary | ICD-10-CM | POA: Diagnosis not present

## 2021-10-02 NOTE — Progress Notes (Unsigned)
Patient ID: Dan Ford                 DOB: Jul 12, 1970                    MRN: [id number]     HPI: Dan Ford is a 51 y.o. male patient seen by Dr. Eldridge Dace referred to lipid clinic by Dr. Philis Nettle, NP. 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   On max doses of meds for hypertriglyceridemia  Ask about diet and exercise (small meals, low or fat free protein, <20g fat per day, veggies, fruits, whole grains, etc), ask about diabetes nutritionist  Ask about diabetes management - consider optimizing regimen   Current Medications: rosuvastatin 40 mg daily, vascepa 2 g twice daily, niaspan 2000 mg daily, fenofibrate 160 mg daily  Intolerances: ozempic (pancreatitis)  Risk Factors: diabetes, hypertension, CAD, HLD  LDL goal: <55 TG goal <150  Diet: eats fruits and vegetables   Exercise: walks regularly, enjoys gardening   Family History: mother- DM, heart disease, atrial fibrillation; father - heart disease  Social History: no alcohol use, reports quit smoking   Labs: 09/10/21: lipid panel -TG 342, LDL 31, TC 128, HDL 29, A1c - 9.4% (on niacin, rosuvastatin, vascepa, fenofibrate)  07/18/21: lipid panel - TG 1977, LDL not reported, TC 283, HDL 27  Past Medical History:  Diagnosis Date   Coronary artery disease    Diabetes mellitus without complication (HCC)    Family history of adverse reaction to anesthesia    Hyperlipidemia    Hypertension     Current Outpatient Medications on File Prior to Visit  Medication Sig Dispense Refill   aspirin EC 81 MG tablet Take 81 mg by mouth daily. Swallow whole.     clopidogrel (PLAVIX) 75 MG tablet Take 1 tablet (75 mg total) by mouth daily. 90 tablet 3   dapagliflozin propanediol (FARXIGA) 10 MG TABS tablet Take 1 tablet (10 mg total) by mouth daily. 90 tablet 3    escitalopram (LEXAPRO) 20 MG tablet Take 20 mg by mouth daily.     fenofibrate 160 MG tablet TAKE 1 TABLET BY MOUTH EVERY DAY 90 tablet 3   insulin aspart protamine- aspart (NOVOLOG MIX 70/30) (70-30) 100 UNIT/ML injection Inject 15 Units into the skin in the morning, at noon, and at bedtime.     insulin detemir (LEVEMIR) 100 UNIT/ML injection Inject 0.25 mLs (25 Units total) into the skin daily. 10 mL 11   lisinopril (ZESTRIL) 10 MG tablet Take 10 mg by mouth daily.     metoprolol succinate (TOPROL-XL) 50 MG 24 hr tablet Take 0.5 tablets (25 mg total) by mouth 2 (two) times daily. Please call [phone] to schedule an appointment for future refills. Thank you. 90 tablet 0   Multiple Vitamins-Minerals (MULTIVITAMIN MEN 50+ PO) Take 1 tablet by mouth daily.     niacin (NIASPAN) 1000 MG CR tablet TAKE 2 TABLETS BY MOUTH AT BEDTIME 180 tablet 3   nitroGLYCERIN (NITROSTAT) 0.4 MG SL tablet Place 1 tablet (0.4 mg total) under the tongue every 5 (five) minutes x 3 doses as needed for chest pain. 25 tablet 5   pantoprazole (PROTONIX) 40 MG tablet Take 40 mg by mouth daily.     rosuvastatin (CRESTOR) 40 MG tablet TAKE 1 TABLET BY MOUTH EVERY DAY FOR CHOLESTEROL 90 tablet 3  VASCEPA 1 g capsule Take 2 g by mouth 2 (two) times daily.     Vitamin D, Ergocalciferol, (DRISDOL) 1.25 MG (50000 UNIT) CAPS capsule Take 50,000 Units by mouth every 7 (seven) days.     zaleplon (SONATA) 10 MG capsule Take 10 mg by mouth at bedtime as needed for sleep.     No current facility-administered medications on file prior to visit.    Allergies  Allergen Reactions   Amoxicillin-Pot Clavulanate Other (See Comments)    Other reaction(s): Other (See Comments) Hx of Clostridium Difficile Enterocolitis on Augmentin Hx of Clostridium Difficile Enterocolitis on Augmentin Hx of Clostridium Difficile Enterocolitis on Augmentin    Iodine-131 Other (See Comments)    Kidney problems   Semaglutide Other (See Comments)     Pancreatitis   Amoxicillin Nausea Only    Assessment/Plan:  1. Hyperlipidemia -   Triglycerides are significantly improved on maximally optimized medications to target triglycerides and are not at goal of <150.    Larena Sox, PharmD PGY1 Pharmacy Resident   10/02/2021  4:13 PM    Thank you,   Olene Floss, Pharm.D, BCPS, CPP [facility]  [address] [place], [place] [address]  Phone: [phone]; Fax: [phone]

## 2021-10-03 ENCOUNTER — Ambulatory Visit: Payer: BC Managed Care – PPO

## 2021-10-15 ENCOUNTER — Encounter: Payer: Self-pay | Admitting: *Deleted

## 2021-10-17 ENCOUNTER — Telehealth: Payer: Self-pay | Admitting: Nurse Practitioner

## 2021-10-17 NOTE — Telephone Encounter (Signed)
S/w patient and updated him regarding his monitor results that did not reveal any significant arrhthymias.Told him he is okay to proceed at an acceptable risk with his upcoming procedure. I notified him about the protocol regarding holding his Aspirin and Plavix and he verbalized understanding and was appreciative of my call.   Sharlene Dory, NP

## 2021-11-11 ENCOUNTER — Other Ambulatory Visit: Payer: Self-pay | Admitting: Interventional Cardiology

## 2022-03-26 ENCOUNTER — Other Ambulatory Visit: Payer: Self-pay | Admitting: Interventional Cardiology

## 2022-06-11 ENCOUNTER — Other Ambulatory Visit: Payer: Self-pay | Admitting: Interventional Cardiology

## 2022-06-24 ENCOUNTER — Other Ambulatory Visit: Payer: Self-pay | Admitting: Interventional Cardiology

## 2022-08-22 ENCOUNTER — Other Ambulatory Visit: Payer: Self-pay | Admitting: Interventional Cardiology

## 2022-12-03 ENCOUNTER — Other Ambulatory Visit: Payer: Self-pay | Admitting: Interventional Cardiology

## 2022-12-13 ENCOUNTER — Other Ambulatory Visit: Payer: Self-pay | Admitting: Interventional Cardiology

## 2022-12-15 ENCOUNTER — Other Ambulatory Visit: Payer: Self-pay

## 2022-12-15 MED ORDER — CLOPIDOGREL BISULFATE 75 MG PO TABS: 75.0000 mg | ORAL_TABLET | Freq: Every day | ORAL | 0 refills | Status: AC

## 2023-01-21 ENCOUNTER — Other Ambulatory Visit: Payer: Self-pay | Admitting: Nurse Practitioner

## 2023-03-13 NOTE — Patient Instructions (Addendum)
 SURGICAL WAITING ROOM VISITATION  Patients having surgery or a procedure may have no more than 2 support people in the waiting area - these visitors may rotate.    Children under the age of 53 must have an adult with them who is not the patient.  Due to an increase in RSV and influenza rates and associated hospitalizations, children ages 1 and under may not visit patients in [facility] hospitals.  If the patient needs to stay at the hospital during part of their recovery, the visitor guidelines for inpatient rooms apply. Pre-op nurse will coordinate an appropriate time for 1 support person to accompany patient in pre-op.  This support person may not rotate.    Please refer to the [facility] website for the visitor guidelines for Inpatients (after your surgery is over and you are in a regular room).       Your procedure is scheduled on: 03/25/23   Report to [facility] Main Entrance    Report to admitting at 9:55 AM   Call this number if you have problems the morning of surgery [phone]   Do not eat food or drink liquids:After Midnight.    Oral Hygiene is also important to reduce your risk of infection.                                    Remember - BRUSH YOUR TEETH THE MORNING OF SURGERY WITH YOUR REGULAR TOOTHPASTE   Stop all vitamins and herbal supplements 7 days before surgery.               Do not take lisinopril  the morning of surgery   Take these medicines the morning of surgery with A SIP OF WATER: Lexapro , Fenofibrate , Metoprolol , Pantoprazole , Rosuvastatin  and vascepa   DO NOT TAKE ANY ORAL DIABETIC MEDICATIONS DAY OF YOUR SURGERY Hold Farxiga  for 72hours prior to surgery. Day before surgery take only 50% of Levemir  insulin  dose Morning of surgery take only 50% of Levemir  dose. Evening before surgery take 70% of evening dose of  Novolog  insulin  Morning of surgery do not take Novolog  insulin   Bring CPAP mask and tubing day of surgery.                               You may not have any metal on your body including hair pins, jewelry, and body piercing             Do not wear make-up, lotions, powders, perfumes/cologne, or deodorant              Men may shave face and neck.   Do not bring valuables to the hospital. [facility] [place] IS NOT             RESPONSIBLE   FOR VALUABLES.   Contacts, glasses, dentures or bridgework may not be worn into surgery.  DO NOT BRING YOUR HOME MEDICATIONS TO THE HOSPITAL. PHARMACY WILL DISPENSE MEDICATIONS LISTED ON YOUR MEDICATION LIST TO YOU DURING YOUR ADMISSION IN THE HOSPITAL!    Patients discharged on the day of surgery will not be allowed to drive home.  Someone NEEDS to stay with you for the first 24 hours after anesthesia.   Special Instructions: Bring a copy of your healthcare power of attorney and living will documents the day of surgery if you haven't scanned them before.  Please read over the following fact sheets you were given: IF YOU HAVE QUESTIONS ABOUT YOUR PRE-OP INSTRUCTIONS PLEASE CALL [phone] [place]   If you received a COVID test during your pre-op visit  it is requested that you wear a mask when out in public, stay away from anyone that may not be feeling well and notify your surgeon if you develop symptoms. If you test positive for Covid or have been in contact with anyone that has tested positive in the last 10 days please notify you surgeon.    [place] - Preparing for Surgery Before surgery, you can play an important role.  Because skin is not sterile, your skin needs to be as free of germs as possible.  You can reduce the number of germs on your skin by washing with CHG (chlorahexidine gluconate) soap before surgery.  CHG is an antiseptic cleaner which kills germs and bonds with the skin to continue killing germs even after washing. Please DO NOT use if you have an allergy to CHG or antibacterial soaps.  If your skin becomes reddened/irritated stop using the CHG and  inform your nurse when you arrive at Short Stay. Do not shave (including legs and underarms) for at least 48 hours prior to the first CHG shower.  You may shave your face/neck.  Please follow these instructions carefully:  1.  Shower with CHG Soap the night before surgery and the  morning of surgery.  2.  If you choose to wash your hair, wash your hair first as usual with your normal  shampoo.  3.  After you shampoo, rinse your hair and body thoroughly to remove the shampoo.                             4.  Use CHG as you would any other liquid soap.  You can apply chg directly to the skin and wash.  Gently with a scrungie or clean washcloth.  5.  Apply the CHG Soap to your body ONLY FROM THE NECK DOWN.   Do   not use on face/ open                           Wound or open sores. Avoid contact with eyes, ears mouth and   genitals (private parts).                       Wash face,  Genitals (private parts) with your normal soap.             6.  Wash thoroughly, paying special attention to the area where your    surgery  will be performed.  7.  Thoroughly rinse your body with warm water from the neck down.  8.  DO NOT shower/wash with your normal soap after using and rinsing off the CHG Soap.                9.  Pat yourself dry with a clean towel.            10.  Wear clean pajamas.            11.  Place clean sheets on your bed the night of your first shower and do not  sleep with pets. Day of Surgery : Do not apply any lotions/deodorants the morning of surgery.  Please wear clean clothes to the hospital/surgery center.  FAILURE TO FOLLOW THESE INSTRUCTIONS MAY RESULT IN THE CANCELLATION OF YOUR SURGERY  PATIENT SIGNATURE_________________________________  NURSE SIGNATURE__________________________________  ________________________________________________________________________Incentive [place]an (Watch this video at home: [email or web address])  An incentive spirometer  is a tool that can help keep your lungs clear and active. This tool measures how well you are filling your lungs with each breath. Taking long deep breaths may help reverse or decrease the chance of developing breathing (pulmonary) problems (especially infection) following: A long period of time when you are unable to move or be active. BEFORE THE PROCEDURE  If the spirometer includes an indicator to show your best effort, your nurse or respiratory therapist will set it to a desired goal. If possible, sit up straight or lean slightly forward. Try not to slouch. Hold the incentive spirometer in an upright position. INSTRUCTIONS FOR USE  Sit on the edge of your bed if possible, or sit up as far as you can in bed or on a chair. Hold the incentive spirometer in an upright position. Breathe out normally. Place the mouthpiece in your mouth and seal your lips tightly around it. Breathe in slowly and as deeply as possible, raising the piston or the ball toward the top of the column. Hold your breath for 3-5 seconds or for as long as possible. Allow the piston or ball to fall to the bottom of the column. Remove the mouthpiece from your mouth and breathe out normally. Rest for a few seconds and repeat Steps 1 through 7 at least 10 times every 1-2 hours when you are awake. Take your time and take a few normal breaths between deep breaths. The spirometer may include an indicator to show your best effort. Use the indicator as a goal to work toward during each repetition. After each set of 10 deep breaths, practice coughing to be sure your lungs are clear. If you have an incision (the cut made at the time of surgery), support your incision when coughing by placing a pillow or rolled up towels firmly against it. Once you are able to get out of bed, walk around indoors and cough well. You may stop using the incentive spirometer when instructed by your caregiver.  RISKS AND COMPLICATIONS Take your time so you do  not get dizzy or light-headed. If you are in pain, you may need to take or ask for pain medication before doing incentive spirometry. It is harder to take a deep breath if you are having pain. AFTER USE Rest and breathe slowly and easily. It can be helpful to keep track of a log of your progress. Your caregiver can provide you with a simple table to help with this. If you are using the spirometer at home, follow these instructions: SEEK MEDICAL CARE IF:  You are having difficultly using the spirometer. You have trouble using the spirometer as often as instructed. Your pain medication is not giving enough relief while using the spirometer. You develop fever of 100.5 F (38.1 C) or higher. SEEK IMMEDIATE MEDICAL CARE IF:  You cough up bloody sputum that had not been present before. You develop fever of 102 F (38.9 C) or greater. You develop worsening pain at or near the incision site. MAKE SURE YOU:  Understand these instructions. Will watch your condition. Will get help right away if you are not doing well or get worse. Document Released: 06/30/2006 Document Revised: 05/12/2011 Document Reviewed: 08/31/2006 [facility] Patient Information 2014 [place], [place].

## 2023-03-13 NOTE — Progress Notes (Addendum)
 COVID Vaccine received:  []  No [x]  Yes Date of any COVID positive Test in last 90 days: no PCP - Katheryn Billing PA Cardiologist - [facility] [place]  Cardiac clearance 09/17/22 Almarie Crate NP  Chest x-ray -  EKG -  03/17/23 Epic Stress Test -  ECHO - 05/02/20 Epic Cardiac Cath - 05/01/20 Epic  Bowel Prep - [x]  No  []   Yes ______  Pacemaker / ICD device [x]  No []  Yes   Spinal Cord Stimulator:[x]  No []  Yes       History of Sleep Apnea? []  No [x]  Yes   CPAP used?- []  No [x]  Yes    Does the patient monitor blood sugar?          []  No [x]  Yes  []  N/A  Patient has: []  NO Hx DM   []  Pre-DM                 [x]  DM1  []   DM2 Does patient have a [employer] or Dexacom? []  No [x]  Yes   Fasting Blood Sugar Ranges- 75-86 Checks Blood Sugar ___3__ times a day  GLP1 agonist / usual dose - Mounjaro- last dose was 1/11/ 25. Instructed not to take until after surgery GLP1 instructions:  SGLT-2 inhibitors / usual dose -  SGLT-2 instructions:   Blood Thinner / Instructions: Aspirin  Instructions:ASA 81mg - Last dose was1/10/25  Comments:   Activity level: Patient is able  to climb a flight of stairs without difficulty; [x]  No CP  [x]  No SOB,  ___   Patient can  perform ADLs without assistance.   Anesthesia review: DM, MI w/ stent, HTN, OSA, CAD  Patient denies shortness of breath, fever, cough and chest pain at PAT appointment.  Patient verbalized understanding and agreement to the Pre-Surgical Instructions that were given to them at this PAT appointment. Patient was also educated of the need to review these PAT instructions again prior to his/her surgery.I reviewed the appropriate phone numbers to call if they have any and questions or concerns.

## 2023-03-17 ENCOUNTER — Encounter (HOSPITAL_COMMUNITY): Payer: Self-pay

## 2023-03-17 ENCOUNTER — Encounter (HOSPITAL_COMMUNITY)
Admission: RE | Admit: 2023-03-17 | Discharge: 2023-03-17 | Disposition: A | Discharge status: Home / Self Care | Payer: BC Managed Care – PPO | Source: Ambulatory Visit | Attending: Orthopaedic Surgery | Admitting: Orthopaedic Surgery

## 2023-03-17 ENCOUNTER — Other Ambulatory Visit: Payer: Self-pay

## 2023-03-17 VITALS — BP 126/79 | HR 88 | Temp 98.1°F | Resp 16 | Ht 70.0 in | Wt 242.0 lb

## 2023-03-17 DIAGNOSIS — E1165 Type 2 diabetes mellitus with hyperglycemia: Secondary | ICD-10-CM | POA: Insufficient documentation

## 2023-03-17 DIAGNOSIS — Z794 Long term (current) use of insulin: Secondary | ICD-10-CM | POA: Diagnosis not present

## 2023-03-17 DIAGNOSIS — Z01818 Encounter for other preprocedural examination: Secondary | ICD-10-CM | POA: Insufficient documentation

## 2023-03-17 DIAGNOSIS — I10 Essential (primary) hypertension: Secondary | ICD-10-CM | POA: Diagnosis not present

## 2023-03-17 HISTORY — DX: Acute myocardial infarction, unspecified: I21.9

## 2023-03-17 HISTORY — DX: Chronic kidney disease, unspecified: N18.9

## 2023-03-17 LAB — BASIC METABOLIC PANEL
Anion gap: 10 (ref 5–15)
BUN: 27 mg/dL — ABNORMAL HIGH (ref 6–20)
CO2: 23 mmol/L (ref 22–32)
Calcium: 8.9 mg/dL (ref 8.9–10.3)
Chloride: 100 mmol/L (ref 98–111)
Creatinine, Ser: 0.77 mg/dL (ref 0.61–1.24)
GFR, Estimated: >60 mL/min (ref >60)
Glucose, Bld: 81 mg/dL (ref 70–99)
Potassium: 4.2 mmol/L (ref 3.5–5.1)
Sodium: 133 mmol/L — ABNORMAL LOW (ref 135–145)

## 2023-03-17 LAB — HEMOGLOBIN A1C
Hgb A1c MFr Bld: 6.3 % — ABNORMAL HIGH (ref 4.8–5.6)
Mean Plasma Glucose: 134.11 mg/dL

## 2023-03-17 LAB — CBC
HCT: 44 % (ref 39.0–52.0)
Hemoglobin: 14.1 g/dL (ref 13.0–17.0)
MCH: 27.9 pg (ref 26.0–34.0)
MCHC: 32 g/dL (ref 30.0–36.0)
MCV: 87.1 fL (ref 80.0–100.0)
Platelets: 323 10*3/uL (ref 150–400)
RBC: 5.05 MIL/uL (ref 4.22–5.81)
RDW: 13.9 % (ref 11.5–15.5)
WBC: 8.6 10*3/uL (ref 4.0–10.5)
nRBC: 0 % (ref 0.0–0.2)

## 2023-03-19 NOTE — Progress Notes (Signed)
 Anesthesia Chart Review   Case: [id number] Date/Time: 03/25/23 1155   Procedures:      SHOULDER ARTHROSCOPY WITH OPEN ROTATOR CUFF REPAIR (Right)     ARTHROSCOPY SHOULDER (Right: Shoulder)   Anesthesia type: General   Pre-op diagnosis: right shoulder cartilage disorder, rotator cuff tear   Location: [facility] ROOM 06 / [facility]   Surgeons: Cristy Bonner DASEN, MD       DISCUSSION:52 y.o. former smoker with h/o HTN, CAD s/p DES 2023, DM II, CKD, right shoulder rotator cuff tear scheduled for above procedure   Pt last seen by cardiology 01/19/2023. Asymptomatic at this visit. Per OV note, Pre-op - 01/19/23 may come off plavix  as has completed 1 year post stent therapy, no cardiac contraindications to surgery.  Cardiac Cath 12/24/22 CONCLUSIONS:   1.  Nonobstructive CAD.  2.  Normal iFR of RCA   VS: BP 126/79   Pulse 88   Temp 36.7 C (Oral)   Resp 16   Ht 5' 10 (1.778 m)   Wt 109.8 kg   SpO2 98%   BMI 34.72 kg/m   PROVIDERS: Duwayne Katheryn HERO, PA is PCP   Arne Feast, MD is Cardiologist  LABS: Labs reviewed: Acceptable for surgery. (all labs ordered are listed, but only abnormal results are displayed)  Labs Reviewed  HEMOGLOBIN A1C - Abnormal; Notable for the following components:      Result Value   Hgb A1c MFr Bld 6.3 (*)    All other components within normal limits  BASIC METABOLIC PANEL - Abnormal; Notable for the following components:   Sodium 133 (*)    BUN 27 (*)    All other components within normal limits  CBC     IMAGES:   EKG:   CV: Echo 05/02/20  1. Left ventricular ejection fraction, by estimation, is 55 to 60%. The  left ventricle has normal function. The left ventricle demonstrates  regional wall motion abnormalities with basal inferior severe hypokinesis.  There is mild left ventricular  hypertrophy. Left ventricular diastolic parameters are consistent with  Grade I diastolic dysfunction (impaired relaxation).   2. Right ventricular systolic  function is normal. The right ventricular  size is normal. There is normal pulmonary artery systolic pressure. The  estimated right ventricular systolic pressure is 14.7 mmHg.   3. The mitral valve is normal in structure. Trivial mitral valve  regurgitation. No evidence of mitral stenosis.   4. The aortic valve is tricuspid. Aortic valve regurgitation is not  visualized. Mild aortic valve sclerosis is present, with no evidence of  aortic valve stenosis.   5. The inferior vena cava is dilated in size with >50% respiratory  variability, suggesting right atrial pressure of 8 mmHg.  Past Medical History:  Diagnosis Date   Chronic kidney disease    Coronary artery disease    Diabetes mellitus without complication (HCC)    Family history of adverse reaction to anesthesia    Hyperlipidemia    Hypertension    Myocardial infarction [facility])     Past Surgical History:  Procedure Laterality Date   APPENDECTOMY     ARTHOSCOPIC ROTAOR CUFF REPAIR     CHOLECYSTECTOMY     CORONARY/GRAFT ACUTE MI REVASCULARIZATION N/A 05/01/2020   Procedure: Coronary/Graft Acute MI Revascularization;  Surgeon: Dann Candyce RAMAN, MD;  Location: [facility] INVASIVE CV LAB;  Service: Cardiovascular;  Laterality: N/A;   LEFT HEART CATH AND CORONARY ANGIOGRAPHY N/A 05/01/2020   Procedure: LEFT HEART CATH AND CORONARY ANGIOGRAPHY;  Surgeon: Dann,  Candyce RAMAN, MD;  Location: [facility] INVASIVE CV LAB;  Service: Cardiovascular;  Laterality: N/A;    MEDICATIONS:  aspirin  EC 81 MG tablet   baclofen (LIORESAL) 10 MG tablet   clopidogrel  (PLAVIX ) 75 MG tablet   dapagliflozin  propanediol (FARXIGA ) 10 MG TABS tablet   escitalopram  (LEXAPRO ) 20 MG tablet   fenofibrate  160 MG tablet   gabapentin (NEURONTIN) 800 MG tablet   HYDROcodone-acetaminophen  (NORCO) 10-325 MG tablet   insulin  detemir (LEVEMIR ) 100 UNIT/ML injection   LANTUS SOLOSTAR 100 UNIT/ML Solostar Pen   lisinopril  (ZESTRIL ) 10 MG tablet   metoprolol  succinate (TOPROL -XL) 50  MG 24 hr tablet   MOUNJARO 15 MG/0.5ML Pen   Multiple Vitamins-Minerals (MULTIVITAMIN MEN 50+ PO)   niacin  (NIASPAN ) 1000 MG CR tablet   nitroGLYCERIN  (NITROSTAT ) 0.4 MG SL tablet   nortriptyline (PAMELOR) 50 MG capsule   NOVOLOG  100 UNIT/ML injection   pantoprazole  (PROTONIX ) 40 MG tablet   rosuvastatin  (CRESTOR ) 40 MG tablet   sertraline (ZOLOFT) 100 MG tablet   spironolactone (ALDACTONE) 25 MG tablet   VASCEPA  1 g capsule   Vitamin D, Ergocalciferol, (DRISDOL) 1.25 MG (50000 UNIT) CAPS capsule   No current facility-administered medications for this encounter.    Harlene Hoots Ward, PA-C [facility] [phone]

## 2023-03-19 NOTE — Anesthesia Preprocedure Evaluation (Addendum)
Anesthesia Evaluation  Patient identified by MRN, date of birth, ID band Patient awake    Reviewed: Allergy & Precautions, NPO status , Patient's Chart, lab work & pertinent test results, reviewed documented beta blocker date and time   Airway Mallampati: III  TM Distance: >3 FB Neck ROM: Full    Dental  (+) Teeth Intact, Dental Advisory Given   Pulmonary sleep apnea and Continuous Positive Airway Pressure Ventilation , former smoker   Pulmonary exam normal breath sounds clear to auscultation       Cardiovascular hypertension, Pt. on home beta blockers and Pt. on medications + CAD, + Past MI and + Cardiac Stents (2023)  Normal cardiovascular exam Rhythm:Regular Rate:Normal  Echo 05/02/20: 1. Left ventricular ejection fraction, by estimation, is 55 to 60%. The  left ventricle has normal function. The left ventricle demonstrates  regional wall motion abnormalities with basal inferior severe hypokinesis.  There is mild left ventricular  hypertrophy. Left ventricular diastolic parameters are consistent with  Grade I diastolic dysfunction (impaired relaxation).   2. Right ventricular systolic function is normal. The right ventricular  size is normal. There is normal pulmonary artery systolic pressure. The  estimated right ventricular systolic pressure is 14.7 mmHg.   3. The mitral valve is normal in structure. Trivial mitral valve  regurgitation. No evidence of mitral stenosis.   4. The aortic valve is tricuspid. Aortic valve regurgitation is not  visualized. Mild aortic valve sclerosis is present, with no evidence of  aortic valve stenosis.   5. The inferior vena cava is dilated in size with >50% respiratory  variability, suggesting right atrial pressure of 8 mmHg.      Neuro/Psych  PSYCHIATRIC DISORDERS Anxiety     negative neurological ROS     GI/Hepatic Neg liver ROS,GERD  Medicated,,  Endo/Other  diabetes, Type 2, Oral  Hypoglycemic Agents, Insulin Dependent  Obesity   Renal/GU Renal InsufficiencyRenal disease     Musculoskeletal negative musculoskeletal ROS (+)    Abdominal   Peds  Hematology   Anesthesia Other Findings Day of surgery medications reviewed with the patient.  Reproductive/Obstetrics                             Anesthesia Physical Anesthesia Plan  ASA: 3  Anesthesia Plan: General   Post-op Pain Management: Regional block* and Tylenol PO (pre-op)*   Induction: Intravenous  PONV Risk Score and Plan: 2 and Midazolam, Dexamethasone and Ondansetron  Airway Management Planned: Oral ETT  Additional Equipment:   Intra-op Plan:   Post-operative Plan: Extubation in OR  Informed Consent: I have reviewed the patients History and Physical, chart, labs and discussed the procedure including the risks, benefits and alternatives for the proposed anesthesia with the patient or authorized representative who has indicated his/her understanding and acceptance.     Dental advisory given  Plan Discussed with: CRNA  Anesthesia Plan Comments: (See PAT note 03/17/23)       Anesthesia Quick Evaluation

## 2023-03-23 NOTE — H&P (Signed)
PREOPERATIVE H&P  Chief Complaint: right shoulder cartilage disorder, rotator cuff tear  HPI: Dan Ford is a 53 y.o. male who is scheduled for, Procedure(s): SHOULDER ARTHROSCOPY WITH OPEN ROTATOR CUFF REPAIR ARTHROSCOPY SHOULDER.   Patient has a past medical history significant for HTN, CAD s/p DES 2023, DM II, CKD .   Patient was doing great after his cuff repair over a year ago.  He had no symptoms whatsoever.  However, in September he was putting up a deer stand and had to catch it as it was falling and immediately had pain.  He felt like his shoulder was back to where it was before he had his repair.  He was worried about it.    Symptoms are rated as moderate to severe, and have been worsening.  This is significantly impairing activities of daily living.    Please see clinic note for further details on this patient's care.    He has elected for surgical management.   Past Medical History:  Diagnosis Date   Chronic kidney disease    Coronary artery disease    Diabetes mellitus without complication (HCC)    Family history of adverse reaction to anesthesia    Hyperlipidemia    Hypertension    Myocardial infarction [facility])    Past Surgical History:  Procedure Laterality Date   APPENDECTOMY     ARTHOSCOPIC ROTAOR CUFF REPAIR     CHOLECYSTECTOMY     CORONARY/GRAFT ACUTE MI REVASCULARIZATION N/A 05/01/2020   Procedure: Coronary/Graft Acute MI Revascularization;  Surgeon: Corky Crafts, MD;  Location: [facility] INVASIVE CV LAB;  Service: Cardiovascular;  Laterality: N/A;   LEFT HEART CATH AND CORONARY ANGIOGRAPHY N/A 05/01/2020   Procedure: LEFT HEART CATH AND CORONARY ANGIOGRAPHY;  Surgeon: Corky Crafts, MD;  Location: [facility] INVASIVE CV LAB;  Service: Cardiovascular;  Laterality: N/A;   Social History   Socioeconomic History   Marital status: Married    Spouse name: Not on file   Number of children: Not on file   Years of education: Not on file   Highest  education level: Not on file  Occupational History   Not on file  Tobacco Use   Smoking status: Former   Smokeless tobacco: Never  Vaping Use   Vaping status: Never Used  Substance and Sexual Activity   Alcohol use: Never   Drug use: Never   Sexual activity: Not on file  Other Topics Concern   Not on file  Social History Narrative   Not on file   Social Drivers of Health   Financial Resource Strain: Low Risk  (06/19/2021)   [facility] visits prior to 05/03/2022.   Overall Financial Resource Strain (CARDIA)    Difficulty of Paying Living Expenses: Not hard at all  Food Insecurity: Low Risk  (12/23/2022)   [facility]   Hunger Vital Sign    Worried About Running Out of Food in the Last Year: Never true    Ran Out of Food in the Last Year: Never true  Transportation Needs: No Transportation Needs (12/23/2022)   Received from [facility]    In the past 12 months, has lack of reliable transportation kept you from medical appointments, meetings, work or from getting things needed for daily living? : No  Physical Activity: Not on file  Stress: Not on file  Social  Connections: Unknown (07/15/2021)   [facility], [facility]   Social Network    Social Network: Not on file   Family History  Problem Relation Age of Onset   Arrhythmia Mother    Atrial fibrillation Mother    Allergies  Allergen Reactions   Amoxicillin-Pot Clavulanate Other (See Comments)    Other reaction(s): Other (See Comments) Hx of Clostridium Difficile Enterocolitis on Augmentin Hx of Clostridium Difficile Enterocolitis on Augmentin Hx of Clostridium Difficile Enterocolitis on Augmentin    Iodine-131 Other (See Comments)    Kidney problems   Semaglutide Other (See Comments)    Pancreatitis   Amoxicillin Nausea Only   Prior to  Admission medications   Medication Sig Start Date End Date Taking? Authorizing Provider  aspirin EC 81 MG tablet Take 81 mg by mouth daily. Swallow whole.   Yes [provider]  baclofen (LIORESAL) 10 MG tablet Take 10 mg by mouth at bedtime as needed for muscle spasms. 02/23/23  Yes [provider]  dapagliflozin propanediol (FARXIGA) 10 MG TABS tablet Take 1 tablet (10 mg total) by mouth daily. 06/25/21  Yes Corky Crafts, MD  escitalopram (LEXAPRO) 20 MG tablet Take 20 mg by mouth daily. 05/28/16  Yes [provider]  fenofibrate 160 MG tablet TAKE 1 TABLET BY MOUTH EVERY DAY 06/11/22  Yes Corky Crafts, MD  gabapentin (NEURONTIN) 800 MG tablet Take 800 mg by mouth 3 (three) times daily. 02/20/23  Yes [provider]  HYDROcodone-acetaminophen (NORCO) 10-325 MG tablet Take 1 tablet by mouth 4 (four) times daily as needed for moderate pain (pain score 4-6). 03/01/23  Yes [provider]  LANTUS SOLOSTAR 100 UNIT/ML Solostar Pen Inject 10 Units into the skin at bedtime.   Yes [provider]  lisinopril (ZESTRIL) 10 MG tablet Take 10 mg by mouth daily.   Yes [provider]  metoprolol succinate (TOPROL-XL) 50 MG 24 hr tablet TAKE 1/2 TABLET (25 MG) BY MOUTH TWICE DAILY 11/12/21  Yes Corky Crafts, MD  [facility] 15 MG/0.5ML Pen Inject 15 mg into the skin once a week. 02/26/23  Yes [provider]  Multiple Vitamins-Minerals (MULTIVITAMIN MEN 50+ PO) Take 1 tablet by mouth daily.   Yes [provider]  nitroGLYCERIN (NITROSTAT) 0.4 MG SL tablet Place 1 tablet (0.4 mg total) under the tongue every 5 (five) minutes x 3 doses as needed for chest pain. 08/28/21  Yes Corky Crafts, MD  nortriptyline (PAMELOR) 50 MG capsule Take 50 mg by mouth at bedtime. 03/09/23  Yes [provider]  NOVOLOG 100 UNIT/ML injection Inject 100 Units into the skin See admin instructions. Use approximately 100 units  daily via Insulin pump 02/26/23  Yes [provider]  pantoprazole (PROTONIX) 40 MG tablet Take 40 mg by mouth daily. 04/09/20  Yes [provider]  rosuvastatin (CRESTOR) 40 MG tablet TAKE 1 TABLET BY MOUTH EVERY DAY FOR CHOLESTEROL 06/24/22  Yes Corky Crafts, MD  sertraline (ZOLOFT) 100 MG tablet Take 100 mg by mouth daily.   Yes [provider]  spironolactone (ALDACTONE) 25 MG tablet Take 25 mg by mouth daily.   Yes [provider]  VASCEPA 1 g capsule Take 2 g by mouth 2 (two) times daily. 04/13/20  Yes [provider]  Vitamin D, Ergocalciferol, (DRISDOL) 1.25 MG (50000 UNIT) CAPS capsule Take 50,000 Units by mouth 2 (two) times a week. 06/15/20  Yes [provider]  clopidogrel (PLAVIX) 75 MG  tablet Take 1 tablet (75 mg total) by mouth daily. Patient not taking: Reported on 03/13/2023 12/15/22   Sharlene Dory, NP  insulin detemir (LEVEMIR) 100 UNIT/ML injection Inject 0.25 mLs (25 Units total) into the skin daily. Patient not taking: Reported on 03/13/2023 07/25/20   Lanae Boast, MD  niacin (NIASPAN) 1000 MG CR tablet TAKE 2 TABLETS BY MOUTH AT BEDTIME Patient not taking: Reported on 03/13/2023 07/02/21   Corky Crafts, MD    ROS: All other systems have been reviewed and were otherwise negative with the exception of those mentioned in the HPI and as above.  Physical Exam: General: Alert, no acute distress Cardiovascular: No pedal edema Respiratory: No cyanosis, no use of accessory musculature GI: No organomegaly, abdomen is soft and non-tender Skin: No lesions in the area of chief complaint Neurologic: Sensation intact distally Psychiatric: Patient is competent for consent with normal mood and affect Lymphatic: No axillary or cervical lymphadenopathy  MUSCULOSKELETAL:  Range of motion of the shoulder is to about 90 degrees.  He has pain with attempts at cuff testing, gross weakness and positive drop arm sign.     Imaging: MRI demonstrates a retear of his rotator cuff and supraspinatus  Assessment: right shoulder cartilage disorder, rotator cuff tear  Plan: Plan for Procedure(s): SHOULDER ARTHROSCOPY WITH OPEN ROTATOR CUFF REPAIR ARTHROSCOPY SHOULDER  The risks benefits and alternatives were discussed with the patient including but not limited to the risks of nonoperative treatment, versus surgical intervention including infection, bleeding, nerve injury,  blood clots, cardiopulmonary complications, morbidity, mortality, among others, and they were willing to proceed.   The patient acknowledged the explanation, agreed to proceed with the plan and consent was signed.   Operative Plan: Right shoulder scope with revision RCR Discharge Medications: standard DVT Prophylaxis: none Physical Therapy: outpatient PT - delayed Special Discharge needs: should bring sling with him   Vernetta Honey, PA-C  03/23/2023 9:43 AM

## 2023-03-24 NOTE — Discharge Instructions (Signed)
Ramond Marrow MD, MPH [name], PA-C [facility] [address] [phone] (tel)   [phone] (fax)   POST-OPERATIVE INSTRUCTIONS - SHOULDER ARTHROSCOPY  WOUND CARE You may remove the Operative Dressing on Post-Op Day #3 (72hrs after surgery).   Alternatively if you would like you can leave dressing on until follow-up if within 7-8 days but keep it dry. Leave steri-strips in place until they fall off on their own, usually 2 weeks postop. There may be a small amount of fluid/bleeding leaking at the surgical site.  This is normal; the shoulder is filled with fluid during the procedure and can leak for 24-48hrs after surgery.  You may change/reinforce the bandage as needed.  Use the Cryocuff or Ice as often as possible for the first 7 days, then as needed for pain relief. Always keep a towel, ACE wrap or other barrier between the cooling unit and your skin.  You may shower on Post-Op Day #3. Gently pat the area dry.  Do not soak the shoulder in water or submerge it.  Keep incisions as dry as possible. Do not go swimming in the pool or ocean until 4 weeks after surgery or when otherwise instructed.    EXERCISES Wear the sling at all times  You may remove the sling for showering, but keep the arm across the chest or in a secondary sling.     It is normal for your fingers/hand to become more swollen after surgery and discolored from bruising.   This will resolve over the first few weeks usually after surgery. Please continue to ambulate and do not stay sitting or lying for too long.  Perform foot and wrist pumps to assist in circulation.  PHYSICAL THERAPY - No therapy for 6 weeks after surgery   REGIONAL ANESTHESIA (NERVE BLOCKS) The anesthesia team may have performed a nerve block for you this is a great tool used to minimize pain.   The block may start wearing off overnight (between 8-24 hours postop) When the block wears off, your pain may go  from nearly zero to the pain you would have had postop without the block. This is an abrupt transition but nothing dangerous is happening.   This can be a challenging period but utilize your as needed pain medications to try and manage this period. We suggest you use the pain medication the first night prior to going to bed, to ease this transition.  You may take an extra dose of narcotic when this happens if needed  POST-OP MEDICATIONS- Multimodal approach to pain control In general your pain will be controlled with a combination of substances.  Prescriptions unless otherwise discussed are electronically sent to your pharmacy.  This is a carefully made plan we use to minimize narcotic use.     Meloxicam - Anti-inflammatory medication taken on a scheduled basis Acetaminophen - Non-narcotic pain medicine taken on a scheduled basis  Oxycodone - This is a strong narcotic, to be used only on an "as needed" basis for SEVERE pain. Zofran - take as needed for nausea   FOLLOW-UP If you develop a Fever (>=101.5), Redness or Drainage from the surgical incision site, please call our office to arrange for an evaluation. Please call the office to schedule a follow-up appointment for your first post-operative appointment, 7-10 days post-operatively.    HELPFUL INFORMATION   You may be more comfortable sleeping in a semi-seated position the first few nights following surgery.  Keep a pillow propped under  the elbow and forearm for comfort.  If you have a recliner type of chair it might be beneficial.  If not that is fine too, but it would be helpful to sleep propped up with pillows behind your operated shoulder as well under your elbow and forearm.  This will reduce pulling on the suture lines.  When dressing, put your operative arm in the sleeve first.  When getting undressed, take your operative arm out last.  Loose fitting, button-down shirts are recommended.  Often in the first days after surgery you  may be more comfortable keeping your operative arm under your shirt and not through the sleeve.  You may return to work/school in the next couple of days when you feel up to it.  Desk work and typing in the sling is fine.  We suggest you use the pain medication the first night prior to going to bed, in order to ease any pain when the anesthesia wears off. You should avoid taking pain medications on an empty stomach as it will make you nauseous.  You should wean off your narcotic medicines as soon as you are able.  Most patients will be off narcotics before their first postop appointment.   Do not drink alcoholic beverages or take illicit drugs when taking pain medications.  It is against the law to drive while taking narcotics.  In some states it is against the law to drive while your arm is in a sling.   Pain medication may make you constipated.  Below are a few solutions to try in this order: Decrease the amount of pain medication if you aren't having pain. Drink lots of decaffeinated fluids. Drink prune juice and/or eat dried prunes  If the first 3 don't work start with additional solutions Take Colace - an over-the-counter stool softener Take Senokot - an over-the-counter laxative Take Miralax - a stronger over-the-counter laxative  For more information including helpful videos and documents visit our website:   [email or web address]

## 2023-03-25 ENCOUNTER — Encounter (HOSPITAL_COMMUNITY): Payer: Self-pay | Admitting: Orthopaedic Surgery

## 2023-03-25 ENCOUNTER — Encounter (HOSPITAL_COMMUNITY)
Admission: RE | Disposition: A | Discharge status: Home / Self Care | Payer: Self-pay | Source: Home / Self Care | Attending: Orthopaedic Surgery

## 2023-03-25 ENCOUNTER — Ambulatory Visit (HOSPITAL_COMMUNITY)
Admission: RE | Admit: 2023-03-25 | Discharge: 2023-03-25 | Disposition: A | Discharge status: Home / Self Care | Payer: BC Managed Care – PPO | Attending: Orthopaedic Surgery | Admitting: Orthopaedic Surgery

## 2023-03-25 ENCOUNTER — Ambulatory Visit (HOSPITAL_COMMUNITY): Payer: Self-pay | Admitting: Certified Registered"

## 2023-03-25 ENCOUNTER — Ambulatory Visit (HOSPITAL_COMMUNITY): Payer: BC Managed Care – PPO | Admitting: Physician Assistant

## 2023-03-25 DIAGNOSIS — I2510 Atherosclerotic heart disease of native coronary artery without angina pectoris: Secondary | ICD-10-CM | POA: Diagnosis not present

## 2023-03-25 DIAGNOSIS — I129 Hypertensive chronic kidney disease with stage 1 through stage 4 chronic kidney disease, or unspecified chronic kidney disease: Secondary | ICD-10-CM | POA: Diagnosis not present

## 2023-03-25 DIAGNOSIS — S46011A Strain of muscle(s) and tendon(s) of the rotator cuff of right shoulder, initial encounter: Secondary | ICD-10-CM | POA: Diagnosis present

## 2023-03-25 DIAGNOSIS — E1122 Type 2 diabetes mellitus with diabetic chronic kidney disease: Secondary | ICD-10-CM | POA: Insufficient documentation

## 2023-03-25 DIAGNOSIS — W208XXA Other cause of strike by thrown, projected or falling object, initial encounter: Secondary | ICD-10-CM | POA: Insufficient documentation

## 2023-03-25 DIAGNOSIS — K219 Gastro-esophageal reflux disease without esophagitis: Secondary | ICD-10-CM | POA: Insufficient documentation

## 2023-03-25 DIAGNOSIS — I252 Old myocardial infarction: Secondary | ICD-10-CM | POA: Insufficient documentation

## 2023-03-25 DIAGNOSIS — Z955 Presence of coronary angioplasty implant and graft: Secondary | ICD-10-CM | POA: Insufficient documentation

## 2023-03-25 DIAGNOSIS — N189 Chronic kidney disease, unspecified: Secondary | ICD-10-CM | POA: Diagnosis not present

## 2023-03-25 DIAGNOSIS — Z794 Long term (current) use of insulin: Secondary | ICD-10-CM | POA: Diagnosis not present

## 2023-03-25 DIAGNOSIS — Z7985 Long-term (current) use of injectable non-insulin antidiabetic drugs: Secondary | ICD-10-CM | POA: Insufficient documentation

## 2023-03-25 DIAGNOSIS — Z87891 Personal history of nicotine dependence: Secondary | ICD-10-CM | POA: Insufficient documentation

## 2023-03-25 DIAGNOSIS — Z7984 Long term (current) use of oral hypoglycemic drugs: Secondary | ICD-10-CM | POA: Diagnosis not present

## 2023-03-25 HISTORY — PX: SHOULDER ARTHROSCOPY WITH OPEN ROTATOR CUFF REPAIR: SHX6092

## 2023-03-25 HISTORY — PX: SHOULDER ARTHROSCOPY: SHX128

## 2023-03-25 LAB — GLUCOSE, CAPILLARY
Glucose-Capillary: 132 mg/dL — ABNORMAL HIGH (ref 70–99)
Glucose-Capillary: 120 mg/dL — ABNORMAL HIGH (ref 70–99)
Glucose-Capillary: 147 mg/dL — ABNORMAL HIGH (ref 70–99)

## 2023-03-25 SURGERY — ARTHROSCOPY, SHOULDER WITH REPAIR, ROTATOR CUFF, OPEN
Anesthesia: General | Site: Shoulder | Laterality: Right

## 2023-03-25 MED ORDER — ORAL CARE MOUTH RINSE: 15.0000 mL | Freq: Once | OROMUCOSAL | Status: AC

## 2023-03-25 MED ORDER — ROCURONIUM BROMIDE 10 MG/ML (PF) SYRINGE
PREFILLED_SYRINGE | INTRAVENOUS | Status: AC
  Filled 2023-03-25: qty 10

## 2023-03-25 MED ORDER — BUPIVACAINE HCL (PF) 0.5 % IJ SOLN
INTRAMUSCULAR | Status: DC | PRN
  Administered 2023-03-25: 10 mL via PERINEURAL

## 2023-03-25 MED ORDER — FENTANYL CITRATE (PF) 100 MCG/2ML IJ SOLN
INTRAMUSCULAR | Status: AC
  Filled 2023-03-25: qty 2

## 2023-03-25 MED ORDER — MELOXICAM 15 MG PO TABS: 15.0000 mg | ORAL_TABLET | Freq: Every day | ORAL | 0 refills | Status: DC

## 2023-03-25 MED ORDER — CHLORHEXIDINE GLUCONATE 0.12 % MT SOLN
15.0000 mL | Freq: Once | OROMUCOSAL | Status: AC
  Administered 2023-03-25: 15 mL via OROMUCOSAL

## 2023-03-25 MED ORDER — PROPOFOL 10 MG/ML IV BOLUS
INTRAVENOUS | Status: AC
  Filled 2023-03-25: qty 20

## 2023-03-25 MED ORDER — LIDOCAINE HCL (CARDIAC) PF 100 MG/5ML IV SOSY
PREFILLED_SYRINGE | INTRAVENOUS | Status: DC | PRN
  Administered 2023-03-25: 80 mg via INTRAVENOUS

## 2023-03-25 MED ORDER — PROPOFOL 10 MG/ML IV BOLUS
INTRAVENOUS | Status: DC | PRN
  Administered 2023-03-25: 170 mg via INTRAVENOUS

## 2023-03-25 MED ORDER — ONDANSETRON HCL 4 MG/2ML IJ SOLN
INTRAMUSCULAR | Status: DC | PRN
  Administered 2023-03-25: 4 mg via INTRAVENOUS

## 2023-03-25 MED ORDER — ONDANSETRON HCL 4 MG/2ML IJ SOLN
4.0000 mg | Freq: Once | INTRAMUSCULAR | Status: DC | PRN
Start: 2023-03-25 — End: 2023-03-25

## 2023-03-25 MED ORDER — ROCURONIUM BROMIDE 100 MG/10ML IV SOLN
INTRAVENOUS | Status: DC | PRN
  Administered 2023-03-25: 60 mg via INTRAVENOUS

## 2023-03-25 MED ORDER — EPINEPHRINE PF 1 MG/ML IJ SOLN
INTRAMUSCULAR | Status: DC | PRN
  Administered 2023-03-25: 2 mg

## 2023-03-25 MED ORDER — ACETAMINOPHEN 500 MG PO TABS
1000.0000 mg | ORAL_TABLET | Freq: Once | ORAL | Status: AC
  Administered 2023-03-25: 1000 mg via ORAL
  Filled 2023-03-25: qty 2

## 2023-03-25 MED ORDER — ONDANSETRON HCL 4 MG/2ML IJ SOLN
INTRAMUSCULAR | Status: AC
Start: 2023-03-25 — End: ?
  Filled 2023-03-25: qty 2

## 2023-03-25 MED ORDER — FENTANYL CITRATE PF 50 MCG/ML IJ SOSY
50.0000 ug | PREFILLED_SYRINGE | Freq: Once | INTRAMUSCULAR | Status: AC
  Administered 2023-03-25: 50 ug via INTRAVENOUS
  Filled 2023-03-25: qty 2

## 2023-03-25 MED ORDER — SUGAMMADEX SODIUM 200 MG/2ML IV SOLN
INTRAVENOUS | Status: DC | PRN
  Administered 2023-03-25: 200 mg via INTRAVENOUS

## 2023-03-25 MED ORDER — TRANEXAMIC ACID-NACL 1000-0.7 MG/100ML-% IV SOLN
1000.0000 mg | INTRAVENOUS | Status: AC
  Administered 2023-03-25: 1000 mg via INTRAVENOUS
  Filled 2023-03-25: qty 100

## 2023-03-25 MED ORDER — CEFAZOLIN SODIUM-DEXTROSE 2-4 GM/100ML-% IV SOLN
2.0000 g | INTRAVENOUS | Status: AC
  Administered 2023-03-25: 2 g via INTRAVENOUS
  Filled 2023-03-25: qty 100

## 2023-03-25 MED ORDER — DEXAMETHASONE SODIUM PHOSPHATE 10 MG/ML IJ SOLN
INTRAMUSCULAR | Status: DC | PRN
  Administered 2023-03-25: 8 mg via INTRAVENOUS

## 2023-03-25 MED ORDER — LACTATED RINGERS IV SOLN
INTRAVENOUS | Status: DC
Start: 2023-03-25 — End: 2023-03-25

## 2023-03-25 MED ORDER — PHENYLEPHRINE 80 MCG/ML (10ML) SYRINGE FOR IV PUSH (FOR BLOOD PRESSURE SUPPORT)
PREFILLED_SYRINGE | INTRAVENOUS | Status: AC
  Filled 2023-03-25: qty 10

## 2023-03-25 MED ORDER — FENTANYL CITRATE (PF) 100 MCG/2ML IJ SOLN
INTRAMUSCULAR | Status: DC | PRN
  Administered 2023-03-25: 100 ug via INTRAVENOUS

## 2023-03-25 MED ORDER — MIDAZOLAM HCL 2 MG/2ML IJ SOLN
1.0000 mg | Freq: Once | INTRAMUSCULAR | Status: AC
  Administered 2023-03-25: 2 mg via INTRAVENOUS
  Filled 2023-03-25: qty 2

## 2023-03-25 MED ORDER — MIDAZOLAM HCL 2 MG/2ML IJ SOLN
INTRAMUSCULAR | Status: AC
Start: 2023-03-25 — End: ?
  Filled 2023-03-25: qty 2

## 2023-03-25 MED ORDER — PHENYLEPHRINE HCL (PRESSORS) 10 MG/ML IV SOLN
INTRAVENOUS | Status: DC | PRN
  Administered 2023-03-25: 160 ug via INTRAVENOUS
  Administered 2023-03-25: 80 ug via INTRAVENOUS
  Administered 2023-03-25 (×2): 160 ug via INTRAVENOUS
  Administered 2023-03-25 (×2): 240 ug via INTRAVENOUS
  Administered 2023-03-25 (×2): 160 ug via INTRAVENOUS

## 2023-03-25 MED ORDER — FENTANYL CITRATE PF 50 MCG/ML IJ SOSY: 25.0000 ug | PREFILLED_SYRINGE | INTRAMUSCULAR | Status: DC | PRN

## 2023-03-25 MED ORDER — ACETAMINOPHEN 500 MG PO TABS: 1000.0000 mg | ORAL_TABLET | Freq: Three times a day (TID) | ORAL | 0 refills | Status: AC

## 2023-03-25 MED ORDER — OXYCODONE HCL 5 MG PO TABS: ORAL_TABLET | ORAL | 0 refills | Status: AC

## 2023-03-25 MED ORDER — LIDOCAINE HCL (PF) 2 % IJ SOLN
INTRAMUSCULAR | Status: AC
  Filled 2023-03-25: qty 5

## 2023-03-25 MED ORDER — AMISULPRIDE (ANTIEMETIC) 5 MG/2ML IV SOLN: 10.0000 mg | Freq: Once | INTRAVENOUS | Status: DC | PRN

## 2023-03-25 MED ORDER — PHENYLEPHRINE HCL-NACL 20-0.9 MG/250ML-% IV SOLN
INTRAVENOUS | Status: DC | PRN
  Administered 2023-03-25: 40 ug/min via INTRAVENOUS

## 2023-03-25 MED ORDER — ONDANSETRON HCL 4 MG PO TABS: 4.0000 mg | ORAL_TABLET | Freq: Three times a day (TID) | ORAL | 0 refills | Status: AC | PRN

## 2023-03-25 MED ORDER — LACTATED RINGERS IV SOLN: INTRAVENOUS | Status: DC | PRN

## 2023-03-25 MED ORDER — DEXAMETHASONE SODIUM PHOSPHATE 10 MG/ML IJ SOLN
INTRAMUSCULAR | Status: AC
  Filled 2023-03-25: qty 1

## 2023-03-25 MED ORDER — EPINEPHRINE PF 1 MG/ML IJ SOLN
INTRAMUSCULAR | Status: AC
  Filled 2023-03-25: qty 4

## 2023-03-25 MED ORDER — SODIUM CHLORIDE 0.9 % IR SOLN
Status: DC | PRN
  Administered 2023-03-25: 30000 mL

## 2023-03-25 MED ORDER — BUPIVACAINE LIPOSOME 1.3 % IJ SUSP
INTRAMUSCULAR | Status: DC | PRN
  Administered 2023-03-25: 10 mL via PERINEURAL

## 2023-03-25 SURGICAL SUPPLY — 100 items
ANCHOR FBRTK 2.6 SUTURETAP 1.3 (Anchor) IMPLANT
ANCHOR PEEK SWIVEL LOCK 5.5 (Anchor) IMPLANT
ANCHOR PSHLK BCMP PEK 3.5X19.5 (Anchor) IMPLANT
ANCHOR PUSHLOCK PEEK 3.5X19.5 (Anchor) ×4 IMPLANT
ANCHOR SUT 1.8 FIBERTAK SB KL (Anchor) IMPLANT
ANCHOR SUT BIO SW 4.75X19.1 (Anchor) IMPLANT
ANCHOR SUT BIOCOMP CORKSREW (Anchor) IMPLANT
ANCHOR SWIVELOCK SP KL 4.75 (Anchor) IMPLANT
BAG COUNTER SPONGE SURGICOUNT (BAG) IMPLANT
BLADE EXCALIBUR 4.0X13 (MISCELLANEOUS) ×2 IMPLANT
BLADE SURG 15 STRL LF DISP TIS (BLADE) ×2 IMPLANT
BLADE SURG SZ10 CARB STEEL (BLADE) ×2 IMPLANT
BURR OVAL 8 FLU 4.0X13 (MISCELLANEOUS) IMPLANT
BURR OVAL 8 FLU 5.0X13 (MISCELLANEOUS) IMPLANT
CANNULA PASSPORT 5 (CANNULA) IMPLANT
CANNULA PASSPORT BUTTON 10-40 (CANNULA) IMPLANT
CANNULA TWIST IN 8.25X7CM (CANNULA) IMPLANT
CHLORAPREP W/TINT 26 (MISCELLANEOUS) ×2 IMPLANT
CLSR STERI-STRIP ANTIMIC 1/2X4 (GAUZE/BANDAGES/DRESSINGS) ×2 IMPLANT
COOLER ICEMAN CLASSIC (MISCELLANEOUS) ×2 IMPLANT
CUTTER KNOT PUSHER W/ SKID (INSTRUMENTS) IMPLANT
DISSECTOR 3.8MM X 13CM (MISCELLANEOUS) ×2 IMPLANT
DRAPE IMP U-DRAPE 54X76 (DRAPES) ×2 IMPLANT
DRAPE INCISE IOBAN 66X45 STRL (DRAPES) IMPLANT
DRAPE POUCH INSTRU U-SHP 10X18 (DRAPES) ×2 IMPLANT
DRAPE STERI 35X30 U-POUCH (DRAPES) ×2 IMPLANT
DRAPE SURG ORHT 6 SPLT 77X108 (DRAPES) ×4 IMPLANT
DRAPE U-SHAPE 47X51 STRL (DRAPES) ×2 IMPLANT
DRAPE U-SHAPE 76X120 STRL (DRAPES) ×2 IMPLANT
DRSG AQUACEL AG ADV 3.5X 6 (GAUZE/BANDAGES/DRESSINGS) ×2 IMPLANT
DW OUTFLOW CASSETTE/TUBE SET (MISCELLANEOUS) ×2 IMPLANT
ELECT MENISCUS 165MM 90D (ELECTRODE) ×2 IMPLANT
ELECT REM PT RETURN 15FT ADLT (MISCELLANEOUS) ×2 IMPLANT
ELECT REM PT RETURN 9FT ADLT (ELECTROSURGICAL) ×2 IMPLANT
ELECTRODE REM PT RTRN 9FT ADLT (ELECTROSURGICAL) ×2 IMPLANT
FIBERSTITCH RC CVD (Anchor) IMPLANT
FIBERWIRE 0 DIAMOND POINT (SUTURE) IMPLANT
GAUZE PAD ABD 8X10 STRL (GAUZE/BANDAGES/DRESSINGS) ×4 IMPLANT
GAUZE SPONGE 4X4 12PLY STRL (GAUZE/BANDAGES/DRESSINGS) ×2 IMPLANT
GLOVE BIO SURGEON STRL SZ 6.5 (GLOVE) ×4 IMPLANT
GLOVE BIOGEL PI IND STRL 6.5 (GLOVE) ×2 IMPLANT
GLOVE BIOGEL PI IND STRL 8 (GLOVE) ×2 IMPLANT
GLOVE ECLIPSE 8.0 STRL XLNG CF (GLOVE) ×2 IMPLANT
GOWN STRL REUS W/ TWL LRG LVL3 (GOWN DISPOSABLE) ×4 IMPLANT
GOWN STRL REUS W/TWL XL LVL3 (GOWN DISPOSABLE) ×2 IMPLANT
GRAFT TISS 20X25 1 THK DERM (Tissue) IMPLANT
IMPL FIBERSTITCH 1.5 CVD (Anchor) IMPLANT
IMPL FIBERSTITCH 1.5 STRT (Anchor) IMPLANT
IMPLANT FIBERSTITCH 1.5 CVD (Anchor) ×2 IMPLANT
IMPLANT FIBERSTITCH 1.5 STRT (Anchor) ×4 IMPLANT
IV NS IRRIG 3000ML ARTHROMATIC (IV SOLUTION) ×8 IMPLANT
KIT ANCHOR FBRTK 2.6 STR (KITS) IMPLANT
KIT BASIN OR (CUSTOM PROCEDURE TRAY) ×2 IMPLANT
KIT STABILIZATION SHOULDER (MISCELLANEOUS) ×2 IMPLANT
KIT STR SPEAR 1.8 FBRTK DISP (KITS) IMPLANT
KIT TURNOVER KIT A (KITS) IMPLANT
MANIFOLD NEPTUNE II (INSTRUMENTS) ×2 IMPLANT
NDL HD SCORPION MEGA LOADER (NEEDLE) IMPLANT
NDL SAFETY ECLIPSE 18X1.5 (NEEDLE) ×2 IMPLANT
PACK ARTHROSCOPY DSU (CUSTOM PROCEDURE TRAY) ×2 IMPLANT
PACK ARTHROSCOPY WL (CUSTOM PROCEDURE TRAY) ×2 IMPLANT
PAD COLD SHLDR WRAP-ON (PAD) ×2 IMPLANT
PAD ORTHO SHOULDER 7X19 LRG (SOFTGOODS) ×2 IMPLANT
PENCIL SMOKE EVACUATOR (MISCELLANEOUS) ×2 IMPLANT
RESTRAINT HEAD UNIVERSAL NS (MISCELLANEOUS) ×2 IMPLANT
SHEET MEDIUM DRAPE 40X70 STRL (DRAPES) ×2 IMPLANT
SLEEVE SCD COMPRESS KNEE MED (STOCKING) ×2 IMPLANT
SLING ARM FOAM STRAP LRG (SOFTGOODS) IMPLANT
SLING ARM FOAM STRAP MED (SOFTGOODS) IMPLANT
SLING ARM FOAM STRAP SML (SOFTGOODS) IMPLANT
SLING ARM FOAM STRAP XLG (SOFTGOODS) IMPLANT
SLING ARM IMMOBILIZER MED (SOFTGOODS) IMPLANT
SPIKE FLUID TRANSFER (MISCELLANEOUS) IMPLANT
SPONGE T-LAP 18X18 ~~LOC~~+RFID (SPONGE) ×2 IMPLANT
SPONGE T-LAP 4X18 ~~LOC~~+RFID (SPONGE) ×2 IMPLANT
SPREADER GRAFT (MISCELLANEOUS) IMPLANT
SUT ETHIBOND 2 V 37 (SUTURE) IMPLANT
SUT ETHIBOND NAB CT1 #1 30IN (SUTURE) IMPLANT
SUT ETHILON 3 0 PS 1 (SUTURE) IMPLANT
SUT FIBERWIRE #2 38 T-5 BLUE (SUTURE) IMPLANT
SUT MNCRL AB 3-0 PS2 18 (SUTURE) IMPLANT
SUT MNCRL AB 4-0 PS2 18 (SUTURE) ×2 IMPLANT
SUT PDS AB 1 CT 36 (SUTURE) IMPLANT
SUT TIGER TAPE 7 IN WHITE (SUTURE) IMPLANT
SUT VIC AB 0 CT1 27XBRD ANBCTR (SUTURE) IMPLANT
SUT VIC AB 3-0 SH 27X BRD (SUTURE) ×2 IMPLANT
SUTURE FIBERWR #2 38 T-5 BLUE (SUTURE) IMPLANT
SUTURE TAPE TIGERLINK 1.3MM BL (SUTURE) IMPLANT
SUTURETAPE TIGERLINK 1.3MM BL (SUTURE) ×4 IMPLANT
SYR 5ML LL (SYRINGE) ×2 IMPLANT
SYR BULB IRRIG 60ML STRL (SYRINGE) ×2 IMPLANT
TAPE FIBER 2MM 7IN #2 BLUE (SUTURE) IMPLANT
TISSUE ARTHROFLEX THICK 4 (Tissue) ×2 IMPLANT
TOWEL GREEN STERILE FF (TOWEL DISPOSABLE) ×4 IMPLANT
TOWEL OR 17X26 10 PK STRL BLUE (TOWEL DISPOSABLE) ×2 IMPLANT
TUBE CONNECTING 20X1/4 (TUBING) ×2 IMPLANT
TUBE SUCTION HIGH CAP CLEAR NV (SUCTIONS) ×2 IMPLANT
TUBING ARTHROSCOPY IRRIG 16FT (MISCELLANEOUS) ×2 IMPLANT
WAND ABLATOR APOLLO I90 (BUR) ×2 IMPLANT
WATER STERILE IRR 1000ML POUR (IV SOLUTION) ×2 IMPLANT

## 2023-03-25 NOTE — Anesthesia Procedure Notes (Signed)
Procedure Name: Intubation Date/Time: 03/25/2023 11:14 AM  Performed by: Jamelle Rushing, CRNAPre-anesthesia Checklist: Patient identified, Emergency Drugs available, Suction available, Patient being monitored and Timeout performed Patient Re-evaluated:Patient Re-evaluated prior to induction Oxygen Delivery Method: Circle system utilized Preoxygenation: Pre-oxygenation with 100% oxygen Induction Type: IV induction Ventilation: Mask ventilation without difficulty Laryngoscope Size: Mac and 3 Grade View: Grade III Tube type: Oral Tube size: 8.0 mm Number of attempts: 1 Airway Equipment and Method: Stylet Secured at: 23 cm Tube secured with: Tape Dental Injury: Teeth and Oropharynx as per pre-operative assessment

## 2023-03-25 NOTE — Op Note (Signed)
Orthopaedic Surgery Operative Note (CSN: [id number])  Dan Ford  11-25-1970 Date of Surgery: 03/25/2023   DIAGNOSES: Right shoulder, acute on chronic rotator cuff tear.  Recurrent rotator cuff tear  POST-OPERATIVE DIAGNOSIS: same  PROCEDURE: Revision rotator cuff repair with cuff mend augmentation   OPERATIVE FINDING: Patient had a tear full-thickness of the rotator cuff just posterior to his previous repair.  There is undersurface leaflet that was also torn but his anterior repair actually appeared to be mostly intact.  Subscapularis appeared to be normal.  Joint surface was essentially normal.  There was sutures from the previous repair that we debrided back slightly.  We had issues with getting fixation of the medial anchors of the cuff mend device which was likely hitting previous sutures however we eventually got 2 sets of sutures to hold it medially.  The revision repair was a 2 x 2 repair with the posterior anchor sutures tied to create medial compression and margin converge the cuff.  We were able to bring the sutures over 2 lateral row anchors with good compression of tissue to bone importantly.    Post-operative plan: The patient will be non-weightbearing in a sling for 6 weeks with therapy to start after 6 weeks.  The patient will be discharged home.  DVT prophylaxis not indicated in ambulatory upper extremity patient without known risk factors.   Pain control with PRN pain medication preferring oral medicines.  Follow up plan will be scheduled in approximately 7 days for incision check and XR.  Surgeons:Primary: Dan Pippin, MD Assistants:Dan McBane, PA-C Location: [facility] ROOM 09 Anesthesia: General with Exparel interscalene block Antibiotics: Ancef 2 g Tourniquet time: None Estimated Blood Loss: Minimal Complications: None Specimens: None Implants: Implant Name Type Inv. Item Serial No. Manufacturer Lot No. LRB No. Used Action  ANCHOR FBRTK 2.6 SUTURETAP 1.3 -  [id number] Anchor ANCHOR FBRTK 2.6 SUTURETAP 1.3  [employer] [id number] Right 1 Implanted  ANCHOR FBRTK 2.6 SUTURETAP 1.3 - [id number] Anchor ANCHOR FBRTK 2.6 SUTURETAP 1.3  [employer] [id number] Right 1 Implanted  TISSUE ARTHROFLEX THICK 4 - [id number] Tissue TISSUE ARTHROFLEX THICK 4 [id number] [facility]  Right 1 Implanted  ANCHOR SUT BIO SW 4.75X19.1 - [id number] Anchor ANCHOR SUT BIO SW 4.75X19.1  [employer] [id number] Right 1 Implanted  ANCHOR SUT BIO SW 4.75X19.1 - [id number] Anchor ANCHOR SUT BIO SW 4.75X19.1  [employer] [id number] Right 1 Implanted  [name] CVD - [id number] Anchor FIBERSTITCH RC CVD  [employer] [id number] Right 1 Implanted  ANCHOR PUSHLOCK PEEK 3.5X19.5 - [id number] Anchor ANCHOR PUSHLOCK PEEK 3.5X19.5  [employer] [id number] Right 1 Implanted  ANCHOR PUSHLOCK PEEK 3.5X19.5 - [id number] Anchor ANCHOR PUSHLOCK PEEK 3.5X19.5  [employer] [id number] Right 1 Implanted  IMPLANT FIBERSTITCH 1.5 STRT - [id number] Anchor IMPLANT FIBERSTITCH 1.5 STRT  [employer] [id number] Right 2 Implanted  IMPLANT FIBERSTITCH 1.5 CVD - [id number] Anchor IMPLANT FIBERSTITCH 1.5 CVD  [employer] [id number] Right 1 Implanted    Indications for Surgery:   Dan Ford is a 53 y.o. male with continued shoulder pain refractory to nonoperative measures for extended period of time.    The risks and benefits were explained at length including but not limited to continued pain, cuff failure, biceps tenodesis failure, stiffness, need for further surgery and infection.   Procedure:   Patient was correctly identified in the preoperative holding area and operative site marked.  Patient brought to OR and positioned beachchair on an [place] table ensuring that all bony prominences  were padded and the head was in an appropriate location.  Anesthesia was induced and the operative shoulder was prepped and draped in the usual sterile fashion.  Timeout was called preincision.  A standard posterior viewing portal was  made after localizing the portal with a spinal needle.  An anterior accessory portal was also made.  After clearing the articular space the camera was positioned in the subacromial space.  Findings above.    Extensive debridement was performed of the anterior interval tissue, labral fraying and the bursa.  Glenoid bone, glenoid cartilage, humeral bone were all debrided.  We identified the revision cuff tear which was an L-type tear with the posterior leaflet just posterior to her previously placed anchors.  We are able to debride unhealthy tissue.  We then prepared the tuberosity for healing.  We placed two 2.6 fiber tack anchors with sliding sutures.  We then were able to pass each of the total of 8 limbs of suture and tied the posterior limbs to marginally converge our tissue.  We then brought the limbs over to a pair of lateral 4.75 bio composite swivel locks 8 to 10 mm below the tuberosity.  We prepared a 2 mm Arthur flex graft on the back table.  We were able to then use this on a graft better and inserted into the joint through a lateral portal.  We had trouble with her initial suture based fixation medially but we were able to place 2 sutures successfully after multiple attempts and get medial fixation.  We fixed the lateral side with a push lock anchor that was in avoidance of her previous lateral row anchors. The incisions were closed with absorbable monocryl and steri strips.  A sterile dressing was placed along with a sling. The patient was awoken from general anesthesia and taken to the PACU in stable condition without complication.   [name], PA-C, present and scrubbed throughout the case, critical for completion in a timely fashion, and for retraction, instrumentation, closure.

## 2023-03-25 NOTE — Interval H&P Note (Signed)
All questions answered, patient wants to proceed with procedure. ? ?

## 2023-03-25 NOTE — Transfer of Care (Signed)
Immediate Anesthesia Transfer of Care Note  Patient: Dan Ford  Procedure(s) Performed: SHOULDER ARTHROSCOPY WITH OPEN ROTATOR CUFF REPAIR (Right) ARTHROSCOPY SHOULDER (Right: Shoulder)  Patient Location: PACU  Anesthesia Type:General and Regional  Level of Consciousness: drowsy  Airway & Oxygen Therapy: Patient Spontanous Breathing and Patient connected to face mask oxygen  Post-op Assessment: Report given to RN and Post -op Vital signs reviewed and stable  Post vital signs: Reviewed and stable  Last Vitals:  Vitals Value Taken Time  BP 118/68 03/25/23 1308  Temp    Pulse 77 03/25/23 1310  Resp 16 03/25/23 1310  SpO2 98 % 03/25/23 1310  Vitals shown include unfiled device data.  Last Pain:  Vitals:   03/25/23 1058  TempSrc:   PainSc: 0-No pain         Complications: No notable events documented.

## 2023-03-25 NOTE — Anesthesia Procedure Notes (Signed)
Anesthesia Regional Block: Interscalene brachial plexus block   Pre-Anesthetic Checklist: , timeout performed,  Correct Patient, Correct Site, Correct Laterality,  Correct Procedure, Correct Position, site marked,  Risks and benefits discussed,  Surgical consent,  Pre-op evaluation,  At surgeon's request and post-op pain management  Laterality: Right  Prep: chloraprep       Needles:  Injection technique: Single-shot  Needle Type: Echogenic Stimulator Needle     Needle Length: 9cm  Needle Gauge: 22     Additional Needles:   Procedures:,,,, ultrasound used (permanent image in chart),,    Narrative:  Start time: 03/25/2023 10:47 AM End time: 03/25/2023 10:55 AM Injection made incrementally with aspirations every 5 mL.  Performed by: Personally  Anesthesiologist: Collene Schlichter, MD  Additional Notes: Functioning IV was confirmed and monitors were applied.  A 90mm 22ga echogenic stimulator needle was used. Sterile prep and drape, hand hygiene, and sterile gloves were used.  Negative aspiration and negative test dose prior to incremental administration of local anesthetic. The patient tolerated the procedure well.  Ultrasound guidance: relevent anatomy identified, needle position confirmed, local anesthetic spread visualized around nerve(s), vascular puncture avoided.  Image printed for medical record.

## 2023-03-25 NOTE — Progress Notes (Addendum)
Per Dr. Desmond Lope , ok with current blood sugar and aware of insulin pump, no need to continue with periop glycemic protocol

## 2023-03-25 NOTE — Anesthesia Postprocedure Evaluation (Signed)
Anesthesia Post Note  Patient: Dan Ford  Procedure(s) Performed: SHOULDER ARTHROSCOPY WITH OPEN ROTATOR CUFF REPAIR (Right) ARTHROSCOPY SHOULDER (Right: Shoulder)     Patient location during evaluation: PACU Anesthesia Type: General Level of consciousness: awake and alert Pain management: pain level controlled Vital Signs Assessment: post-procedure vital signs reviewed and stable Respiratory status: spontaneous breathing, nonlabored ventilation and respiratory function stable Cardiovascular status: blood pressure returned to baseline and stable Postop Assessment: no apparent nausea or vomiting Anesthetic complications: no   No notable events documented.  Last Vitals:  Vitals:   03/25/23 1400 03/25/23 1415  BP: 120/69 117/71  Pulse: 82 83  Resp: 12 12  Temp:    SpO2: 97% 97%    Last Pain:  Vitals:   03/25/23 1354  TempSrc:   PainSc: 0-No pain                 Collene Schlichter

## 2023-03-26 ENCOUNTER — Encounter (HOSPITAL_COMMUNITY): Payer: Self-pay | Admitting: Orthopaedic Surgery

## 2023-05-30 ENCOUNTER — Other Ambulatory Visit: Payer: Self-pay | Admitting: Interventional Cardiology

## 2023-09-20 ENCOUNTER — Other Ambulatory Visit: Payer: Self-pay | Admitting: Nurse Practitioner

## 2024-03-04 ENCOUNTER — Encounter: Payer: Self-pay | Admitting: *Deleted

## 2024-03-04 DIAGNOSIS — Z006 Encounter for examination for normal comparison and control in clinical research program: Secondary | ICD-10-CM

## 2024-03-04 NOTE — Research (Signed)
 Message left about [employer] financial study. Encouraged Mr Mullendore to call if he would like more information.
